# Patient Record
Sex: Female | Born: 1955 | Race: White | Hispanic: No | Marital: Single | Smoking: Former smoker
Health system: Southern US, Community
[De-identification: ages and names within clinical notes are randomized; demographics above are authoritative.]

## PROBLEM LIST (undated history)

## (undated) DIAGNOSIS — C801 Malignant (primary) neoplasm, unspecified: Secondary | ICD-10-CM

## (undated) DIAGNOSIS — Z923 Personal history of irradiation: Secondary | ICD-10-CM

## (undated) DIAGNOSIS — C50919 Malignant neoplasm of unspecified site of unspecified female breast: Secondary | ICD-10-CM

## (undated) HISTORY — PX: SPINAL FUSION: SHX223

## (undated) HISTORY — PX: EYE SURGERY: SHX253

---

## 2009-02-13 ENCOUNTER — Encounter: Admission: RE | Admit: 2009-02-13 | Discharge: 2009-02-13 | Payer: Self-pay | Admitting: Family Medicine

## 2011-08-18 ENCOUNTER — Other Ambulatory Visit: Payer: Self-pay | Admitting: Family Medicine

## 2011-08-18 ENCOUNTER — Other Ambulatory Visit (HOSPITAL_COMMUNITY)
Admission: RE | Admit: 2011-08-18 | Discharge: 2011-08-18 | Disposition: A | Discharge status: Home / Self Care | Payer: BC Managed Care – PPO | Source: Ambulatory Visit | Attending: Family Medicine | Admitting: Family Medicine

## 2011-08-18 DIAGNOSIS — Z124 Encounter for screening for malignant neoplasm of cervix: Secondary | ICD-10-CM | POA: Insufficient documentation

## 2011-08-18 DIAGNOSIS — Z1159 Encounter for screening for other viral diseases: Secondary | ICD-10-CM | POA: Insufficient documentation

## 2011-08-18 DIAGNOSIS — R921 Mammographic calcification found on diagnostic imaging of breast: Secondary | ICD-10-CM

## 2011-08-27 ENCOUNTER — Ambulatory Visit
Admission: RE | Admit: 2011-08-27 | Discharge: 2011-08-27 | Disposition: A | Discharge status: Home / Self Care | Payer: BC Managed Care – PPO | Source: Ambulatory Visit | Attending: Family Medicine | Admitting: Family Medicine

## 2011-08-27 DIAGNOSIS — R921 Mammographic calcification found on diagnostic imaging of breast: Secondary | ICD-10-CM

## 2012-10-18 ENCOUNTER — Other Ambulatory Visit: Payer: Self-pay

## 2012-10-18 DIAGNOSIS — Z1231 Encounter for screening mammogram for malignant neoplasm of breast: Secondary | ICD-10-CM

## 2012-10-28 ENCOUNTER — Other Ambulatory Visit: Payer: Self-pay | Admitting: Family Medicine

## 2012-10-28 ENCOUNTER — Ambulatory Visit
Admission: RE | Admit: 2012-10-28 | Discharge: 2012-10-28 | Disposition: A | Discharge status: Home / Self Care | Payer: BC Managed Care – PPO | Source: Ambulatory Visit | Attending: Family Medicine | Admitting: Family Medicine

## 2012-10-28 DIAGNOSIS — M25462 Effusion, left knee: Secondary | ICD-10-CM

## 2012-11-01 ENCOUNTER — Ambulatory Visit
Admission: RE | Admit: 2012-11-01 | Discharge: 2012-11-01 | Disposition: A | Discharge status: Home / Self Care | Payer: BC Managed Care – PPO | Source: Ambulatory Visit

## 2012-11-01 DIAGNOSIS — Z1231 Encounter for screening mammogram for malignant neoplasm of breast: Secondary | ICD-10-CM

## 2013-05-17 ENCOUNTER — Other Ambulatory Visit: Payer: Self-pay | Admitting: Family Medicine

## 2013-05-17 ENCOUNTER — Ambulatory Visit
Admission: RE | Admit: 2013-05-17 | Discharge: 2013-05-17 | Disposition: A | Discharge status: Home / Self Care | Payer: BC Managed Care – PPO | Source: Ambulatory Visit | Attending: Family Medicine | Admitting: Family Medicine

## 2013-05-17 DIAGNOSIS — M25469 Effusion, unspecified knee: Secondary | ICD-10-CM

## 2013-05-17 DIAGNOSIS — R109 Unspecified abdominal pain: Secondary | ICD-10-CM

## 2013-10-10 ENCOUNTER — Other Ambulatory Visit: Payer: Self-pay

## 2013-10-10 DIAGNOSIS — Z1231 Encounter for screening mammogram for malignant neoplasm of breast: Secondary | ICD-10-CM

## 2013-11-07 ENCOUNTER — Ambulatory Visit
Admission: RE | Admit: 2013-11-07 | Discharge: 2013-11-07 | Disposition: A | Discharge status: Home / Self Care | Payer: BC Managed Care – PPO | Source: Ambulatory Visit

## 2013-11-07 DIAGNOSIS — Z1231 Encounter for screening mammogram for malignant neoplasm of breast: Secondary | ICD-10-CM

## 2015-02-08 ENCOUNTER — Other Ambulatory Visit: Payer: Self-pay

## 2015-02-08 DIAGNOSIS — Z1231 Encounter for screening mammogram for malignant neoplasm of breast: Secondary | ICD-10-CM

## 2015-02-27 ENCOUNTER — Ambulatory Visit
Admission: RE | Admit: 2015-02-27 | Discharge: 2015-02-27 | Disposition: A | Discharge status: Home / Self Care | Payer: 59 | Source: Ambulatory Visit

## 2015-02-27 DIAGNOSIS — Z1231 Encounter for screening mammogram for malignant neoplasm of breast: Secondary | ICD-10-CM

## 2016-01-23 ENCOUNTER — Other Ambulatory Visit: Payer: Self-pay | Admitting: Family Medicine

## 2016-01-23 DIAGNOSIS — Z1231 Encounter for screening mammogram for malignant neoplasm of breast: Secondary | ICD-10-CM

## 2016-03-09 HISTORY — PX: BREAST LUMPECTOMY: SHX2

## 2016-03-12 ENCOUNTER — Ambulatory Visit: Payer: Self-pay

## 2016-03-26 ENCOUNTER — Inpatient Hospital Stay: Admission: RE | Admit: 2016-03-26 | Payer: Self-pay | Source: Ambulatory Visit

## 2016-04-09 DIAGNOSIS — C801 Malignant (primary) neoplasm, unspecified: Secondary | ICD-10-CM

## 2016-04-09 DIAGNOSIS — C50919 Malignant neoplasm of unspecified site of unspecified female breast: Secondary | ICD-10-CM

## 2016-04-09 HISTORY — DX: Malignant (primary) neoplasm, unspecified: C80.1

## 2016-04-09 HISTORY — DX: Malignant neoplasm of unspecified site of unspecified female breast: C50.919

## 2016-04-20 ENCOUNTER — Ambulatory Visit
Admission: RE | Admit: 2016-04-20 | Discharge: 2016-04-20 | Disposition: A | Discharge status: Home / Self Care | Payer: 59 | Source: Ambulatory Visit | Attending: Family Medicine | Admitting: Family Medicine

## 2016-04-20 DIAGNOSIS — Z1231 Encounter for screening mammogram for malignant neoplasm of breast: Secondary | ICD-10-CM

## 2016-04-21 ENCOUNTER — Other Ambulatory Visit: Payer: Self-pay | Admitting: Family Medicine

## 2016-04-21 DIAGNOSIS — R928 Other abnormal and inconclusive findings on diagnostic imaging of breast: Secondary | ICD-10-CM

## 2016-04-24 ENCOUNTER — Ambulatory Visit
Admission: RE | Admit: 2016-04-24 | Discharge: 2016-04-24 | Disposition: A | Discharge status: Home / Self Care | Payer: 59 | Source: Ambulatory Visit | Attending: Family Medicine | Admitting: Family Medicine

## 2016-04-24 ENCOUNTER — Other Ambulatory Visit: Payer: Self-pay | Admitting: Family Medicine

## 2016-04-24 DIAGNOSIS — N6320 Unspecified lump in the left breast, unspecified quadrant: Secondary | ICD-10-CM

## 2016-04-24 DIAGNOSIS — R928 Other abnormal and inconclusive findings on diagnostic imaging of breast: Secondary | ICD-10-CM

## 2016-04-28 ENCOUNTER — Telehealth: Payer: Self-pay | Admitting: *Deleted

## 2016-04-28 NOTE — Telephone Encounter (Signed)
Confirmed [facility] for 04/29/16 at 0815 .  Instructions and contact information given.

## 2016-05-05 ENCOUNTER — Other Ambulatory Visit: Payer: Self-pay | Admitting: *Deleted

## 2016-05-05 DIAGNOSIS — Z170 Estrogen receptor positive status [ER+]: Principal | ICD-10-CM

## 2016-05-05 DIAGNOSIS — C50212 Malignant neoplasm of upper-inner quadrant of left female breast: Secondary | ICD-10-CM

## 2016-05-06 ENCOUNTER — Other Ambulatory Visit: Payer: Self-pay | Admitting: *Deleted

## 2016-05-06 ENCOUNTER — Ambulatory Visit (HOSPITAL_BASED_OUTPATIENT_CLINIC_OR_DEPARTMENT_OTHER): Payer: 59 | Admitting: Hematology and Oncology

## 2016-05-06 ENCOUNTER — Encounter: Payer: Self-pay | Admitting: Physical Therapy

## 2016-05-06 ENCOUNTER — Ambulatory Visit
Admission: RE | Admit: 2016-05-06 | Discharge: 2016-05-06 | Disposition: A | Discharge status: Home / Self Care | Payer: 59 | Source: Ambulatory Visit | Attending: Radiation Oncology | Admitting: Radiation Oncology

## 2016-05-06 ENCOUNTER — Other Ambulatory Visit (HOSPITAL_BASED_OUTPATIENT_CLINIC_OR_DEPARTMENT_OTHER): Payer: 59

## 2016-05-06 ENCOUNTER — Ambulatory Visit: Payer: 59 | Attending: Surgery | Admitting: Physical Therapy

## 2016-05-06 ENCOUNTER — Encounter: Payer: Self-pay | Admitting: *Deleted

## 2016-05-06 ENCOUNTER — Encounter: Payer: Self-pay | Admitting: Hematology and Oncology

## 2016-05-06 ENCOUNTER — Other Ambulatory Visit: Payer: Self-pay | Admitting: Surgery

## 2016-05-06 DIAGNOSIS — C50212 Malignant neoplasm of upper-inner quadrant of left female breast: Secondary | ICD-10-CM

## 2016-05-06 DIAGNOSIS — Z170 Estrogen receptor positive status [ER+]: Principal | ICD-10-CM

## 2016-05-06 DIAGNOSIS — R293 Abnormal posture: Secondary | ICD-10-CM | POA: Insufficient documentation

## 2016-05-06 DIAGNOSIS — C50912 Malignant neoplasm of unspecified site of left female breast: Secondary | ICD-10-CM

## 2016-05-06 LAB — CBC WITH DIFFERENTIAL/PLATELET
BASO%: 0.1 % (ref 0.0–2.0)
Basophils Absolute: 0 10*3/uL (ref 0.0–0.1)
EOS%: 2.2 % (ref 0.0–7.0)
Eosinophils Absolute: 0.2 10*3/uL (ref 0.0–0.5)
HEMATOCRIT: 40.4 % (ref 34.8–46.6)
HEMOGLOBIN: 13.7 g/dL (ref 11.6–15.9)
LYMPH#: 1.6 10*3/uL (ref 0.9–3.3)
LYMPH%: 23.6 % (ref 14.0–49.7)
MCH: 32.2 pg (ref 25.1–34.0)
MCHC: 33.9 g/dL (ref 31.5–36.0)
MCV: 95.1 fL (ref 79.5–101.0)
MONO#: 0.5 10*3/uL (ref 0.1–0.9)
MONO%: 7.1 % (ref 0.0–14.0)
NEUT%: 67 % (ref 38.4–76.8)
NEUTROS ABS: 4.6 10*3/uL (ref 1.5–6.5)
PLATELETS: 218 10*3/uL (ref 145–400)
RBC: 4.25 10*6/uL (ref 3.70–5.45)
RDW: 13.3 % (ref 11.2–14.5)
WBC: 6.9 10*3/uL (ref 3.9–10.3)

## 2016-05-06 LAB — COMPREHENSIVE METABOLIC PANEL
ALT: 26 U/L (ref 0–55)
ANION GAP: 12 meq/L — AB (ref 3–11)
AST: 24 U/L (ref 5–34)
Albumin: 4.7 g/dL (ref 3.5–5.0)
Alkaline Phosphatase: 79 U/L (ref 40–150)
BILIRUBIN TOTAL: 0.47 mg/dL (ref 0.20–1.20)
BUN: 14.5 mg/dL (ref 7.0–26.0)
CALCIUM: 9.8 mg/dL (ref 8.4–10.4)
CHLORIDE: 103 meq/L (ref 98–109)
CO2: 24 meq/L (ref 22–29)
CREATININE: 0.8 mg/dL (ref 0.6–1.1)
EGFR: 82 mL/min/{1.73_m2} — ABNORMAL LOW (ref >90)
Glucose: 114 mg/dl (ref 70–140)
Potassium: 3.9 mEq/L (ref 3.5–5.1)
Sodium: 138 mEq/L (ref 136–145)
TOTAL PROTEIN: 7.6 g/dL (ref 6.4–8.3)

## 2016-05-06 NOTE — Progress Notes (Signed)
Clinical Social Work [place] Psychosocial Distress Screening [place]  Patient completed distress screening protocol and scored a 7 on the Psychosocial Distress Thermometer which indicates moderate distress. Clinical Social Worker met with patient in [facility] to assess for distress and other psychosocial needs. Patient stated she was feeling overwhelmed but felt "better" after meeting with the treatment team and getting more information on her treatment plan. CSW and patient discussed common feeling and emotions when being diagnosed with cancer, and the importance of support during treatment. CSW informed patient of the support team and support services at [facility]. CSW provided contact information and encouraged patient to call with any questions or concerns.   ONCBCN DISTRESS SCREENING 05/06/2016  Screening Type Initial Screening  Distress experienced in past week (1-10) 7  Practical problem type Work/school  Emotional problem type Nervousness/Anxiety  Physical Problem type Sleep/insomnia  Physician notified of physical symptoms Yes    Johnnye Lana, MSW, LCSW, OSW-C Clinical Social Worker [place] [phone]

## 2016-05-06 NOTE — Progress Notes (Signed)
[facility] CONSULT NOTE  Patient Care Team: Dibas Koirala, MD as PCP - General (Family Medicine) Alphonsa Overall, MD as Consulting Physician (General Surgery) Nicholas Lose, MD as Consulting Physician (Hematology and Oncology) Eppie Gibson, MD as Attending Physician (Radiation Oncology)  CHIEF COMPLAINTS/PURPOSE OF CONSULTATION:  Newly diagnosed breast cancer  HISTORY OF PRESENTING ILLNESS:  Candice Caldwell 61 y.o. female is here because of recent diagnosis of left breast cancer. Patient had a screening mammogram that detected left breast distortion with calcifications in the upper inner quadrant. By ultrasound at 11:00 it measured 1.1 cm. There was an adjacent mass at 12:00 position also 1.1 cm. Both of these lesions were biopsied. 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 The total o'clock mass was benign and it was concordant. Axilla was negative. She was presented this morning at the multidisciplinary tumor board and she is here today to discuss the treatment plan. 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  I reviewed her records extensively and collaborated the history with the patient.  SUMMARY OF ONCOLOGIC HISTORY:   Malignant neoplasm of upper-inner quadrant of left breast in female, estrogen receptor positive ([place])   04/24/2016 Initial Diagnosis    Left breast biopsy 12:00: Benign, left breast biopsy 11:00: IDC low-grade, ER 100%, PR 95%, HER-2 negative ratio 1.68, Ki-67 5%; screening detected left breast distortion and calcifications UIQ 1.1 cm at 11:00 position axilla negative, T1 cN0 stage IA clinical stage      MEDICAL HISTORY:  History reviewed. No pertinent past medical history.  SURGICAL HISTORY: Past Surgical History:  Procedure Laterality Date  . SPINAL FUSION      SOCIAL HISTORY: Social History   Social History  . Marital status:  Single    Spouse name: N/A  . Number of children: N/A  . Years of education: N/A   Occupational History  . Not on file.   Social History Main Topics  . Smoking status: Former Smoker    Types: Cigarettes    Quit date: 04/30/2016  . Smokeless tobacco: Not on file  . Alcohol use Yes     Comment: 6-8 week  . Drug use: No  . Sexual activity: Not on file   Other Topics Concern  . Not on file   Social History Narrative  . No narrative on file    FAMILY HISTORY: History reviewed. No pertinent family history.  ALLERGIES:  has No Known Allergies.  MEDICATIONS:  Current Outpatient Prescriptions  Medication Sig Dispense Refill  . aspirin 81 MG tablet Take 81 mg by mouth daily.    . Multiple Vitamins-Minerals (OCUVITE EXTRA) TABS Take 1 tablet by mouth daily.    . Omega-3 Fatty Acids (FISH OIL CONCENTRATE PO) Take 1 tablet by mouth daily.     No current facility-administered medications for this visit.     REVIEW OF SYSTEMS:   Constitutional: Denies fevers, chills or abnormal night sweats Eyes: Denies blurriness of vision, double vision or watery eyes Ears, nose, mouth, throat, and face: Denies mucositis or sore throat Respiratory: Denies cough, dyspnea or wheezes Cardiovascular: Denies palpitation, chest discomfort or lower extremity swelling Gastrointestinal:  Denies nausea, heartburn or change in bowel habits Skin: Denies abnormal skin rashes Lymphatics: Denies new lymphadenopathy or easy bruising Neurological:Denies numbness, tingling or new weaknesses Behavioral/Psych: Mood is stable, no new changes  Breast:  Denies any palpable  lumps or discharge All other systems were reviewed with the patient and are negative.  PHYSICAL EXAMINATION: ECOG PERFORMANCE STATUS: 0 - Asymptomatic  Vitals:   05/06/16 0837  BP: (!) 165/80  Pulse: 94  Resp: 18  Temp: 98.3 F (36.8 C)   Filed Weights   05/06/16 0837  Weight: 137 lb 12.8 oz (62.5 kg)    GENERAL:alert, no distress  and comfortable SKIN: skin color, texture, turgor are normal, no rashes or significant lesions EYES: normal, conjunctiva are pink and non-injected, sclera clear OROPHARYNX:no exudate, no erythema and lips, buccal mucosa, and tongue normal  NECK: supple, thyroid normal size, non-tender, without nodularity LYMPH:  no palpable lymphadenopathy in the cervical, axillary or inguinal LUNGS: clear to auscultation and percussion with normal breathing effort HEART: regular rate & rhythm and no murmurs and no lower extremity edema ABDOMEN:abdomen soft, non-tender and normal bowel sounds Musculoskeletal:no cyanosis of digits and no clubbing  PSYCH: alert & oriented x 3 with fluent speech NEURO: no focal motor/sensory deficits BREAST: No palpable nodules in breast. No palpable axillary or supraclavicular lymphadenopathy (exam performed in the presence of a chaperone)   LABORATORY DATA:  I have reviewed the data as listed Lab Results  Component Value Date   WBC 6.9 05/06/2016   HGB 13.7 05/06/2016   HCT 40.4 05/06/2016   MCV 95.1 05/06/2016   PLT 218 05/06/2016   Lab Results  Component Value Date   NA 138 05/06/2016   K 3.9 05/06/2016   CO2 24 05/06/2016    RADIOGRAPHIC STUDIES: I have personally reviewed the radiological reports and agreed with the findings in the report.  ASSESSMENT AND PLAN:  Malignant neoplasm of upper-inner quadrant of left breast in female, estrogen receptor positive ([place]) Left breast biopsy 12:00: Benign, left breast biopsy 11:00: IDC low-grade, ER 100%, PR 95%, HER-2 negative ratio 1.68, Ki-67 5%; screening detected left breast distortion and calcifications UIQ 1.1 cm at 11:00 position axilla negative, T1 cN0 stage IA clinical stage  Pathology and radiology counseling:Discussed with the patient, the details of pathology including the type of breast cancer,the clinical staging, the significance of ER, PR and HER-2/neu receptors and the implications for treatment. After  reviewing the pathology in detail, we proceeded to discuss the different treatment options between surgery, radiation, chemotherapy, antiestrogen therapies.  Recommendations: 1. Breast conserving surgery followed by 2. Oncotype DX testing to determine if chemotherapy would be of any benefit followed by 3. Adjuvant radiation therapy followed by 4. Adjuvant antiestrogen therapy  Oncotype counseling: I discussed Oncotype DX test. I explained to the patient that this is a 21 gene panel to evaluate patient tumors DNA to calculate recurrence score. This would help determine whether patient has high risk or intermediate risk or low risk breast cancer. She understands that if her tumor was found to be high risk, she would benefit from systemic chemotherapy. If low risk, no need of chemotherapy. If she was found to be intermediate risk, we would need to evaluate the score as well as other risk factors and determine if an abbreviated chemotherapy may be of benefit.  Return to clinic after surgery to discuss final pathology report and then determine if Oncotype DX testing will need to be sent.   All questions were answered. The patient knows to call the clinic with any problems, questions or concerns.    Rulon Eisenmenger, MD 05/06/16

## 2016-05-06 NOTE — Patient Instructions (Signed)

## 2016-05-06 NOTE — Therapy (Addendum)
[place], [place], [address] Phone: [phone]   Fax:  [phone]  Physical Therapy Evaluation  Patient Details  Name: Candice Caldwell MRN: [id number] Date of Birth: 25-Nov-1955 Referring Provider: Dr. Alphonsa Overall  Encounter Date: 05/06/2016      PT End of Session - 05/06/16 1051    Visit Number 1   Number of Visits 1   PT Start Time 0349   PT Stop Time 0955  Also saw pt from 1045 to 1055 for a total of 30 minutes   PT Time Calculation (min) 20 min   Activity Tolerance Patient tolerated treatment well   Behavior During Therapy [facility] for tasks assessed/performed      History reviewed. No pertinent past medical history.  Past Surgical History:  Procedure Laterality Date  . SPINAL FUSION      There were no vitals filed for this visit.       Subjective Assessment - 05/06/16 1052    Subjective Patient reports she is here today to be seen by her medical team for her newly diagnosed left breast cancer.   Pertinent History Patient was diagnosed on 04/20/16 with left invasive ductal carcinoma breast cancer. There are 2 masses both measuring 1.1 cm located in the upper inner quadrant. They are ER/PR positive and HER2 negative with a Ki67 of 5%. She has no other medical problems.   Patient Stated Goals reduce lymphedema risk and learn post op shoulder ROM HEP            [facility] PT Assessment - 05/06/16 0001      Assessment   Medical Diagnosis Left breast cancer   Referring Provider Dr. Alphonsa Overall   Onset Date/Surgical Date 04/20/16   Hand Dominance Right   Prior Therapy none     Precautions   Precautions Other (comment)   Precaution Comments active cancer     Restrictions   Weight Bearing Restrictions No     Balance Screen   Has the patient fallen in the past 6 months No   Has the patient had a decrease in activity level because of a fear of falling?  No   Is the patient reluctant to  leave their home because of a fear of falling?  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   Leisure She walks 6-7 miles (2 hours) 3x/week     Cognition   Overall Cognitive Status Within Functional Limits for tasks assessed     Posture/Postural Control   Posture/Postural Control Postural limitations   Postural Limitations Rounded Shoulders;Forward head     ROM / Strength   AROM / PROM / Strength AROM;Strength     AROM   AROM Assessment Site Shoulder;Cervical   Right/Left Shoulder Right;Left   Right Shoulder Extension 70 Degrees   Right Shoulder Flexion 154 Degrees   Right Shoulder ABduction 163 Degrees   Right Shoulder Internal Rotation 74 Degrees   Right Shoulder External Rotation 72 Degrees   Left Shoulder Extension 60 Degrees   Left Shoulder Flexion 150 Degrees   Left Shoulder ABduction 159 Degrees   Left Shoulder Internal Rotation 62 Degrees   Left Shoulder External Rotation 72 Degrees   Cervical Flexion WNL   Cervical Extension 75% limited  Cervical - Right Side Bend 50% limited   Cervical - Left Side Bend 50% limited   Cervical - Right Rotation 50% limited   Cervical - Left Rotation 50% limited     Strength   Overall Strength Within functional limits for tasks performed           LYMPHEDEMA/ONCOLOGY QUESTIONNAIRE - 05/06/16 1008      Type   Cancer Type Left breast cancer     Lymphedema Assessments   Lymphedema Assessments Upper extremities     Right Upper Extremity Lymphedema   10 cm Proximal to Olecranon Process 25.6 cm   Olecranon Process 23 cm   10 cm Proximal to Ulnar Styloid Process 20.3 cm   Just Proximal to Ulnar Styloid Process 14.9 cm   Across Hand at [employer] 19 cm   At [place] of 2nd Digit 6.3 cm     Left Upper Extremity  Lymphedema   10 cm Proximal to Olecranon Process 24.5 cm   Olecranon Process 23.4 cm   10 cm Proximal to Ulnar Styloid Process 19.2 cm   Just Proximal to Ulnar Styloid Process 14.6 cm   Across Hand at [employer] 18.8 cm   At [place] of 2nd Digit 6.1 cm        Patient was instructed today in a home exercise program today for post op shoulder range of motion. These included active assist shoulder flexion in sitting, scapular retraction, wall walking with shoulder abduction, and hands behind head external rotation.  She was encouraged to do these twice a day, holding 3 seconds and repeating 5 times when permitted by her physician.          PT Education - 05/06/16 1009    Education provided Yes   Education Details Lymphedema risk reduction and post op shoulder ROM HEP   Person(s) Educated Patient   Methods Explanation;Demonstration;Handout   Comprehension Returned demonstration;Verbalized understanding              Breast Clinic Goals - 05/06/16 1104      Patient will be able to verbalize understanding of pertinent lymphedema risk reduction practices relevant to her diagnosis specifically related to skin care.   Time 1   Period Days   Status Achieved     Patient will be able to return demonstrate and/or verbalize understanding of the post-op home exercise program related to regaining shoulder range of motion.   Time 1   Period Days   Status Achieved     Patient will be able to verbalize understanding of the importance of attending the postoperative After Breast Cancer Class for further lymphedema risk reduction education and therapeutic exercise.   Time 1   Period Days   Status Achieved              Plan - 05/06/16 1052    Clinical Impression Statement Patient was diagnosed on 04/20/16 with left invasive ductal carcinoma breast cancer. There are 2 masses both measuring 1.1 cm located in the upper inner quadrant. They are ER/PR positive and HER2 negative with  a Ki67 of 5%. She has no other medical problems. Her multidisciplinary medical team met prior to her assessments to determine a recommended treatment plan.  She is planning to have a left lumpectomy and sentinel node biopsy followed by Oncotype testing, radiation, and anti-estrogen therapy. She may benefit from post op PT to regain shoulder ROM and reduce lymphedema risk. Due to her lack of comorbidities, her eval is of  low complexity.   Rehab Potential Excellent   Clinical Impairments Affecting Rehab Potential none   PT Frequency One time visit   PT Treatment/Interventions Therapeutic exercise;Patient/family education   PT Next Visit Plan Will f/u after surgery to determine needs   PT Home Exercise Plan Post op shoulder ROM HEP   Consulted and Agree with Plan of Care Patient      Patient will benefit from skilled therapeutic intervention in order to improve the following deficits and impairments:  Postural dysfunction, Decreased knowledge of precautions, Impaired UE functional use, Decreased range of motion  Visit Diagnosis: Carcinoma of upper-inner quadrant of left breast in female, estrogen receptor positive ([place]) - Plan: PT plan of care cert/re-cert  Abnormal posture - Plan: PT plan of care cert/re-cert   Patient will follow up at outpatient cancer rehab if needed following surgery.  If the patient requires physical therapy at that time, a specific plan will be dictated and sent to the referring physician for approval. The patient was educated today on appropriate basic range of motion exercises to begin post operatively and the importance of attending the After Breast Cancer class following surgery.  Patient was educated today on lymphedema risk reduction practices as it pertains to recommendations that will benefit the patient immediately following surgery.  She verbalized good understanding.  No additional physical therapy is indicated at this time.     Problem List Patient Active  Problem List   Diagnosis Date Noted  . Malignant neoplasm of upper-inner quadrant of left breast in female, estrogen receptor positive ([place]) 05/05/2016    Annia Friendly, PT 05/06/16 12:04 PM  [place], [place], [address] Phone: [phone]   Fax:  [phone]  Name: Candice Caldwell MRN: [id number] Date of Birth: 1955/03/12

## 2016-05-06 NOTE — Assessment & Plan Note (Signed)
Left breast biopsy 12:00: Benign, left breast biopsy 11:00: IDC low-grade, ER 100%, PR 95%, HER-2 negative ratio 1.68, Ki-67 5%; screening detected left breast distortion and calcifications UIQ 1.1 cm at 11:00 position axilla negative, T1 cN0 stage IA clinical stage  Pathology and radiology counseling:Discussed with the patient, the details of pathology including the type of breast cancer,the clinical staging, the significance of ER, PR and HER-2/neu receptors and the implications for treatment. After reviewing the pathology in detail, we proceeded to discuss the different treatment options between surgery, radiation, chemotherapy, antiestrogen therapies.  Recommendations: 1. Breast conserving surgery followed by 2. Oncotype DX testing to determine if chemotherapy would be of any benefit followed by 3. Adjuvant radiation therapy followed by 4. Adjuvant antiestrogen therapy  Oncotype counseling: I discussed Oncotype DX test. I explained to the patient that this is a 21 gene panel to evaluate patient tumors DNA to calculate recurrence score. This would help determine whether patient has high risk or intermediate risk or low risk breast cancer. She understands that if her tumor was found to be high risk, she would benefit from systemic chemotherapy. If low risk, no need of chemotherapy. If she was found to be intermediate risk, we would need to evaluate the score as well as other risk factors and determine if an abbreviated chemotherapy may be of benefit.  Return to clinic after surgery to discuss final pathology report and then determine if Oncotype DX testing will need to be sent.

## 2016-05-06 NOTE — Progress Notes (Signed)
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 female who had a screening bilateral mammogram on 04/20/16. This showed a possible distortion and calcification in the left breast. Diagnostic mammogram of the left breast on 04/24/16 showed an architectural distortion in the UIQ left breast middle to posterior depth with associated faint microcalcifications. On physical exam, there was palpable thickening in the left breast 11:00 position anterior to middle depth. Ultrasound of the left breast showed an irregular mass measuring 0.8 x 1.1 x 1.0 cm in the 11:00 position 3 cm from the nipple and a mass measuring 1.0 x 1.1 x 0.9 cm in the 12:00 position 2 cm from the nipple. Distance between the two masses is 2.1 cm. There was no evidence of left axillary lymphadenopathy. Biopsied were collected on 04/24/16. Biopsy of the left breast 12:00 position revealed breast tissue with benign changes and no malignancy was identified. Biopsy of the left breast 11:00 position revealed low grade IDC (ER 100% positive, PR 95% positive, HER2 negative, Ki67 5%). The patient presents today in multidisciplinary breast clinic to discuss treatment options for the management of her disease.  Gynecologic History  Age at first menstrual period? 15  Are you still having periods? No  If you no longer have periods: Have you used hormone replacement? No Obstetric History:  How many children have you carried to term? 0  Pregnant now or trying to get pregnant? No  Have you used birth control pills or hormone shots for contraception? Yes, birth control  If so,  for how long (or approximate dates)? "Ages 51 to 80 or 50?" Health Maintenance:  Have you ever had a colonoscopy? Yes If yes, date? 10/17/2013  Have you ever had a bone density? No  Date of your last PAP smear? 08/18/2011 Date of your FIRST mammogram? ?  PREVIOUS RADIATION THERAPY: No  PAST MEDICAL HISTORY:  has no past medical history on file.    PAST SURGICAL HISTORY:No past surgical history on file.  FAMILY HISTORY: family history is not on file.  SOCIAL HISTORY:    ALLERGIES: Patient has no known allergies.  MEDICATIONS:  Current Outpatient Prescriptions  Medication Sig Dispense Refill  . aspirin 81 MG tablet Take 81 mg by mouth daily.    . Multiple Vitamins-Minerals (OCUVITE EXTRA) TABS Take 1 tablet by mouth daily.    . Omega-3 Fatty Acids (FISH OIL CONCENTRATE PO) Take 1 tablet by mouth daily.     No current facility-administered medications for this encounter.     REVIEW OF SYSTEMS:  On review of systems the patient reports loss of sleep, fatigue, and wears glasses. She denies difficulties with her ENT, hearth, lungs, bowels, urine, skin, muscle and bones, nervous system, mental health, hormones, blood/lymph system, or her immune system.  Pertinent items noted in HPI and remainder of comprehensive ROS otherwise negative.   PHYSICAL EXAM:  Vitals with BMI 05/06/2016  Height 5' 8"   Weight 137 lbs 13 oz  BMI 21  Systolic 621  Diastolic 80  Pulse 94  Respirations 18  General: Alert and oriented, in no acute distress HEENT: Head is normocephalic. Extraocular movements are intact.  Oropharynx is clear. Neck: Neck is supple, no palpable cervical or supraclavicular lymphadenopathy. Heart: Regular in rate and rhythm with no murmurs, rubs, or gallops. Chest: Clear to auscultation bilaterally, with no rhonchi, wheezes, or rales. Extremities: No cyanosis or edema. Lymphatics: see Neck Exam. Axillary exam was negative. Skin: No concerning lesions. Musculoskeletal: symmetric  strength and muscle tone throughout. Neurologic: Cranial nerves II through XII are grossly intact. No obvious focalities. Speech is fluent. Coordination is intact. Psychiatric: Judgment and insight are intact. Affect is appropriate. Breast: Right breast no palpable mass or nipple discharge. Left breast some bruising in the UOQ, no palpable mass, no nipple discharge or bleeding, some thickening noted in the 11-12:00 position that may be bruising from biopsy.  ECOG = 0  LABORATORY DATA:  Lab Results  Component Value Date   WBC 6.9 05/06/2016   HGB 13.7 05/06/2016   HCT 40.4 05/06/2016   MCV 95.1 05/06/2016   PLT 218 05/06/2016   NEUTROABS 4.6 05/06/2016   Lab Results  Component Value Date   NA 138 05/06/2016   K 3.9 05/06/2016   CO2 24 05/06/2016   GLUCOSE 114 05/06/2016   CREATININE 0.8 05/06/2016   CALCIUM 9.8 05/06/2016      RADIOGRAPHY: US Breast Ltd Uni Left Inc Axilla  Result Date: 04/24/2016 CLINICAL DATA:  Left superior breast architectural distortion with possible calcifications seen on most recent screening mammography. EXAM: 2D DIGITAL DIAGNOSTIC LEFT MAMMOGRAM WITH CAD AND ADJUNCT TOMO ULTRASOUND LEFT BREAST COMPARISON:  Previous exam(s). ACR Breast Density Category c: The breast tissue is heterogeneously dense, which may obscure small masses. FINDINGS: Mammographically, there is a persistent architectural distortion in the left breast upper inner quadrant, middle to posterior depth, with associated faint microcalcifications. Mammographic images were processed with CAD. On physical exam, there is a palpable thickening in the left 11 o'clock breast, anterior to middle depth. Targeted ultrasound is performed, showing left breast 11 o'clock 3 cm from the nipple irregular taller than wide hypoechoic mass which measures 0.8 x 1.1 x 1.0 cm. Internal hyperechoic foci may represent microcalcifications. There is an increased blood flow anteriorly. This finding likely corresponds to the  mammographically seen area of distortion. It the left breast 12 o'clock 2 cm from the nipple there is an ill-defined hypoechoic angulated mass versus dense breast tissue which measures 1.0 x 1.1 x 0.9 cm. The distance between the 2 masses is 2.1 cm. There is no evidence of left axillary lymphadenopathy. IMPRESSION: Two left breast masses, for which ultrasound-guided core needle biopsy is recommended. No evidence of left axillary lymphadenopathy. RECOMMENDATION: Ultrasound-guided core needle biopsy of the left breast. I have discussed the findings and recommendations with the patient. Results were also provided in writing at the conclusion of the visit. If applicable, a reminder letter will be sent to the patient regarding the next appointment. BI-RADS CATEGORY  4: Suspicious. Electronically Signed   By: Fidela Salisbury M.D.   On: 04/24/2016 09:38   Mm Diag Breast Tomo Uni Left  Result Date: 04/24/2016 CLINICAL DATA:  Left superior breast architectural distortion with possible calcifications seen on most recent screening mammography. EXAM: 2D DIGITAL DIAGNOSTIC LEFT MAMMOGRAM WITH CAD AND ADJUNCT TOMO ULTRASOUND LEFT BREAST COMPARISON:  Previous exam(s). ACR Breast Density Category c: The breast tissue is heterogeneously dense, which may obscure small masses. FINDINGS: Mammographically, there is a persistent architectural distortion in the left breast upper inner quadrant, middle to posterior depth, with associated faint microcalcifications. Mammographic images were processed with CAD. On physical  exam, there is a palpable thickening in the left 11 o'clock breast, anterior to middle depth. Targeted ultrasound is performed, showing left breast 11 o'clock 3 cm from the nipple irregular taller than wide hypoechoic mass which measures 0.8 x 1.1 x 1.0 cm. Internal hyperechoic foci may represent microcalcifications. There is an increased blood flow anteriorly. This finding likely corresponds to the mammographically  seen area of distortion. It the left breast 12 o'clock 2 cm from the nipple there is an ill-defined hypoechoic angulated mass versus dense breast tissue which measures 1.0 x 1.1 x 0.9 cm. The distance between the 2 masses is 2.1 cm. There is no evidence of left axillary lymphadenopathy. IMPRESSION: Two left breast masses, for which ultrasound-guided core needle biopsy is recommended. No evidence of left axillary lymphadenopathy. RECOMMENDATION: Ultrasound-guided core needle biopsy of the left breast. I have discussed the findings and recommendations with the patient. Results were also provided in writing at the conclusion of the visit. If applicable, a reminder letter will be sent to the patient regarding the next appointment. BI-RADS CATEGORY  4: Suspicious. Electronically Signed   By: Fidela Salisbury M.D.   On: 04/24/2016 09:38   Mm Screening Breast Tomo Bilateral  Result Date: 04/20/2016 CLINICAL DATA:  Screening. EXAM: 2D DIGITAL SCREENING BILATERAL MAMMOGRAM WITH CAD AND ADJUNCT TOMO COMPARISON:  Previous exam(s). ACR Breast Density Category d: The breast tissue is extremely dense, which lowers the sensitivity of mammography. FINDINGS: In the left breast, possible distortion and calcifications warrant further evaluation. In the right breast, no findings suspicious for malignancy. Images were processed with CAD. IMPRESSION: Further evaluation is suggested for possible distortion and calcifications in the left breast. RECOMMENDATION: Diagnostic mammogram and possibly ultrasound of the left breast. (Code:[id number]-L-40M) The patient will be contacted regarding the findings, and additional imaging will be scheduled. BI-RADS CATEGORY  0: Incomplete. Need additional imaging evaluation and/or prior mammograms for comparison. Electronically Signed   By: Ammie Ferrier M.D.   On: 04/20/2016 12:39   Mm Clip Placement Left  Result Date: 04/24/2016 CLINICAL DATA:  Post ultrasound-guided core needle biopsy of the  left breast. EXAM: DIAGNOSTIC LEFT MAMMOGRAM POST ULTRASOUND BIOPSY COMPARISON:  Previous exam(s). FINDINGS: Mammographic images were obtained following ultrasound-guided biopsy of left breast 11 o'clock and 12 o'clock masses. Two-view mammography demonstrates presence of heart shaped marker within the 12 o'clock biopsy site, and ribbon shaped marker within the 11 o'clock biopsy site. The ribbon shaped marker, associated with the 11 o'clock mass corresponds to the area of architectural distortion seen mammographically, and is located 5 mm anterior and 6 mm medial to the geometric center of the distortion. IMPRESSION: Successful placement of tissue markers, post ultrasound-guided biopsy of the left breast: 1. 12 o'clock mass- heart shaped marker. 2. Eleven o'clock mass- ribbon shaped marker. Final Assessment: Post Procedure Mammograms for Marker Placement Electronically Signed   By: Fidela Salisbury M.D.   On: 04/24/2016 16:16   [place] Lt Breast Bx W Loc Dev 1st Lesion Img Bx Spec US Guide  Addendum Date: 04/28/2016   ADDENDUM REPORT: 04/28/2016 08:47 ADDENDUM: Pathology revealed BREAST TISSUE WITH BENIGN CHANGES of the Left breast, 12:00 o'clock. The changes consist of microcalcifications, fibrosis, ductal epithelial hyperplasia and focal cystic dilatation. INVASIVE DUCTAL CARCINOMA, LOW GRADE of the Left breast, 11:00 o'clock. This was found to be concordant by Dr. Fidela Salisbury. Pathology results were discussed with the patient by telephone. The patient reported doing well after the biopsies with tenderness at the sites. Post biopsy instructions and  care were reviewed and questions were answered. The patient was encouraged to call [facility] for any additional concerns. The patient was referred to [facility] on May 06, 2016. Pathology results reported by Terie Purser, RN on 04/28/2016.  Electronically Signed   By: Fidela Salisbury M.D.   On: 04/28/2016 08:47   Result Date: 04/28/2016 CLINICAL DATA:  Left breast 11 o'clock suspicious mass, and left breast 12 o'clock mass versus dense fibroglandular tissue. EXAM: ULTRASOUND GUIDED LEFT BREAST CORE NEEDLE BIOPSY COMPARISON:  Previous exam(s). FINDINGS: I met with the patient and we discussed the procedure of ultrasound-guided biopsy, including benefits and alternatives. We discussed the high likelihood of a successful procedure. We discussed the risks of the procedure, including infection, bleeding, tissue injury, clip migration, and inadequate sampling. Informed written consent was given. The usual time-out protocol was performed immediately prior to the procedure. Using sterile technique and 1% Lidocaine as local anesthetic, under direct ultrasound visualization, a 14 gauge spring-loaded device was used to perform biopsy of left breast 12 o'clock mass using a latter approach. At the conclusion of the procedure a heart shaped tissue marker clip was deployed into the biopsy cavity. Next, using sterile technique and 1% Lidocaine as local anesthetic, under direct ultrasound visualization, a 14 gauge spring-loaded device was used to perform biopsy of left breast 11 o'clock mass using a lateral approach. At the conclusion of the procedure a ribbon shaped tissue marker clip was deployed into the biopsy cavity. Follow up 2 view mammogram was performed and dictated separately. IMPRESSION: Ultrasound guided biopsy of left breast 12 o'clock and 11 o'clock masses. No apparent complications. Electronically Signed: By: Fidela Salisbury M.D. On: 04/24/2016 15:45   [place] Lt Breast Bx W Loc Dev Ea Add Lesion Img Bx Spec US Guide  Addendum Date: 04/28/2016   ADDENDUM REPORT: 04/28/2016 08:47 ADDENDUM: Pathology revealed BREAST TISSUE WITH BENIGN CHANGES of the Left breast, 12:00 o'clock. The changes consist of microcalcifications, fibrosis, ductal epithelial  hyperplasia and focal cystic dilatation. INVASIVE DUCTAL CARCINOMA, LOW GRADE of the Left breast, 11:00 o'clock. This was found to be concordant by Dr. Fidela Salisbury. Pathology results were discussed with the patient by telephone. The patient reported doing well after the biopsies with tenderness at the sites. Post biopsy instructions and care were reviewed and questions were answered. The patient was encouraged to call [facility] for any additional concerns. The patient was referred to [facility] on May 06, 2016. Pathology results reported by Terie Purser, RN on 04/28/2016. Electronically Signed   By: Fidela Salisbury M.D.   On: 04/28/2016 08:47   Result Date: 04/28/2016 CLINICAL DATA:  Left breast 11 o'clock suspicious mass, and left breast 12 o'clock mass versus dense fibroglandular tissue. EXAM: ULTRASOUND GUIDED LEFT BREAST CORE NEEDLE BIOPSY COMPARISON:  Previous exam(s). FINDINGS: I met with the patient and we discussed the procedure of ultrasound-guided biopsy, including benefits and alternatives. We discussed the high likelihood of a successful procedure. We discussed the risks of the procedure, including infection, bleeding, tissue injury, clip migration, and inadequate sampling. Informed written consent was given. The usual time-out protocol was performed immediately prior to the procedure. Using sterile technique and 1% Lidocaine as local anesthetic, under direct ultrasound visualization, a 14 gauge spring-loaded device was used to perform biopsy of left breast 12 o'clock mass using a latter  approach. At the conclusion of the procedure a heart shaped tissue marker clip was deployed into the biopsy cavity. Next, using sterile technique and 1% Lidocaine as local anesthetic, under direct ultrasound visualization, a 14 gauge spring-loaded device was used to perform biopsy of left breast 11  o'clock mass using a lateral approach. At the conclusion of the procedure a ribbon shaped tissue marker clip was deployed into the biopsy cavity. Follow up 2 view mammogram was performed and dictated separately. IMPRESSION: Ultrasound guided biopsy of left breast 12 o'clock and 11 o'clock masses. No apparent complications. Electronically Signed: By: Fidela Salisbury M.D. 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 I spoke to the patient today regarding her diagnosis and options for treatment. We discussed the equivalence in terms of survival and local failure between mastectomy and breast conservation. We discussed the role of radiation in decreasing local failures in patients who undergo lumpectomy. We discussed the process of CT simulation and the placement tattoos. We discussed 4-6 weeks of treatment as an outpatient. We discussed the possibility of asymptomatic lung damage. We discussed the low likelihood of secondary malignancies. We discussed the possible side effects including but not limited to skin redness, fatigue, permanent skin darkening, and breast swelling.  We discussed the use of cardiac sparing with deep inspiration breath hold if needed.  PLAN: The patient will have a bilateral breast MRI to better visualize the patient's breasts due to having extremely dense breast tissue. She will undergo a left partial mastectomy with sentinel node procedure and Oncotype DX testing will be performed on the tissue to determine the benefit of adjuvant therapy. If chemotherapy is required, then radiation therapy would occur after this. If chemotherapy is of minimal benefit, then the patient would proceed to undergo radiation therapy after surgery. After completing radiation therapy she will be placed on  an aromatase inhibitor. I will see the patient a few weeks after surgery or after chemotherapy if it is required.     ------------------------------------------------  Blair Promise, PhD, MD  This document serves as a record of services personally performed by Gery Pray, MD. It was created on his behalf by Darcus Austin, a trained medical scribe. The creation of this record is based on the scribe's personal observations and the provider's statements to them. This document has been checked and approved by the attending provider.

## 2016-05-06 NOTE — Progress Notes (Signed)
Nutrition Assessment  Reason for Assessment:  Pt seen in Breast Clinic  ASSESSMENT:   61 year old female with new diagnosis of left breast cancer.  Past medical history reviewed  Patient reports good appetite.  Medications:  reviewed  Labs: reviewed  Anthropometrics:   Height: 68 inches Weight: 137 pounds BMI: 21   NUTRITION DIAGNOSIS: Food and nutrition related knowledge deficit related to new diagnosis of breast cancer as evidenced by no prior need for nutrition related information.  INTERVENTION:   Discussed and provided packet of information regarding nutritional tips for breast cancer patients.  Questions answered.  Teachback method used.  Contact information provided and patient knows to contact me with questions/concerns.    MONITORING, EVALUATION, and GOAL: Pt will consume a healthy plant based diet to maintain lean body mass throughout treatment.   Brittyn Salaz B. Zenia Resides, [place], [place] Registered Dietitian [phone] (pager)

## 2016-05-11 ENCOUNTER — Encounter (HOSPITAL_COMMUNITY): Payer: Self-pay

## 2016-05-11 ENCOUNTER — Telehealth: Payer: Self-pay | Admitting: *Deleted

## 2016-05-11 ENCOUNTER — Other Ambulatory Visit: Payer: Self-pay | Admitting: *Deleted

## 2016-05-11 ENCOUNTER — Ambulatory Visit (HOSPITAL_COMMUNITY)
Admission: RE | Admit: 2016-05-11 | Discharge: 2016-05-11 | Disposition: A | Discharge status: Home / Self Care | Payer: 59 | Source: Ambulatory Visit | Attending: Hematology and Oncology | Admitting: Hematology and Oncology

## 2016-05-11 DIAGNOSIS — Z170 Estrogen receptor positive status [ER+]: Principal | ICD-10-CM

## 2016-05-11 DIAGNOSIS — C50212 Malignant neoplasm of upper-inner quadrant of left female breast: Secondary | ICD-10-CM

## 2016-05-11 MED ORDER — GADOBENATE DIMEGLUMINE 529 MG/ML IV SOLN: 15.0000 mL | Freq: Once | INTRAVENOUS | Status: DC | PRN

## 2016-05-11 NOTE — Telephone Encounter (Signed)
  Oncology Nurse Navigator Documentation  Navigator Location: [facility] (05/11/16 1200)   )Navigator Encounter Type: Telephone (05/11/16 1200) Telephone: Outgoing Call;Clinic/[facility] Follow-up (05/11/16 1200)                                                  Time Spent with Patient: 15 (05/11/16 1200)

## 2016-05-11 NOTE — Progress Notes (Signed)
Attempted patient for Breast MRi, began [facility] MRI Seq and patient squeezed ball within 25 seconds yelling stating that she had intense sharp pain in her right mid to low back, I pulled her out and she stated is was only when I began the seq. I called Dr Logan Bores, radiologist, and we agreed to stop the scan. I will call Mateo Flow RN at the [facility] to let her know   bhj

## 2016-05-11 NOTE — Telephone Encounter (Signed)
This RN was contacted by Horris Latino in MRI stating inability to obtain MRI due to patient stating intense pain in lower back post start of MRI.  Per further inquiry- discomfort only occurred when MRI magnets were engaged.  Pt states she had " surgery in my lower back when I was a teenager and I think there are screws and pins in there"  This note will be given to MD for review for appropriate follow up.

## 2016-05-14 ENCOUNTER — Other Ambulatory Visit: Payer: Self-pay | Admitting: Surgery

## 2016-05-21 ENCOUNTER — Other Ambulatory Visit: Payer: Self-pay | Admitting: Surgery

## 2016-05-21 DIAGNOSIS — Z170 Estrogen receptor positive status [ER+]: Principal | ICD-10-CM

## 2016-05-21 DIAGNOSIS — C50912 Malignant neoplasm of unspecified site of left female breast: Secondary | ICD-10-CM

## 2016-05-25 ENCOUNTER — Telehealth: Payer: Self-pay | Admitting: Hematology and Oncology

## 2016-05-25 NOTE — Telephone Encounter (Signed)
Spoke with patient re 4/9 f/u.

## 2016-05-29 ENCOUNTER — Encounter (HOSPITAL_BASED_OUTPATIENT_CLINIC_OR_DEPARTMENT_OTHER): Payer: Self-pay | Admitting: *Deleted

## 2016-06-01 ENCOUNTER — Ambulatory Visit
Admission: RE | Admit: 2016-06-01 | Discharge: 2016-06-01 | Disposition: A | Discharge status: Home / Self Care | Payer: 59 | Source: Ambulatory Visit | Attending: Surgery | Admitting: Surgery

## 2016-06-01 DIAGNOSIS — Z170 Estrogen receptor positive status [ER+]: Principal | ICD-10-CM

## 2016-06-01 DIAGNOSIS — C50912 Malignant neoplasm of unspecified site of left female breast: Secondary | ICD-10-CM

## 2016-06-03 NOTE — Progress Notes (Signed)
Boost drink given with instructions to complete by 0615 dos, pt verbalized understanding. 

## 2016-06-04 ENCOUNTER — Ambulatory Visit
Admission: RE | Admit: 2016-06-04 | Discharge: 2016-06-04 | Disposition: A | Discharge status: Home / Self Care | Payer: 59 | Source: Ambulatory Visit | Attending: Surgery | Admitting: Surgery

## 2016-06-04 ENCOUNTER — Encounter (HOSPITAL_BASED_OUTPATIENT_CLINIC_OR_DEPARTMENT_OTHER): Payer: Self-pay | Admitting: *Deleted

## 2016-06-04 ENCOUNTER — Ambulatory Visit (HOSPITAL_BASED_OUTPATIENT_CLINIC_OR_DEPARTMENT_OTHER)
Admission: RE | Admit: 2016-06-04 | Discharge: 2016-06-04 | Disposition: A | Discharge status: Home / Self Care | Payer: Self-pay | Source: Ambulatory Visit | Attending: Surgery | Admitting: Surgery

## 2016-06-04 ENCOUNTER — Ambulatory Visit (HOSPITAL_COMMUNITY)
Admission: RE | Admit: 2016-06-04 | Discharge: 2016-06-04 | Disposition: A | Discharge status: Home / Self Care | Payer: Self-pay | Source: Ambulatory Visit | Attending: Surgery | Admitting: Surgery

## 2016-06-04 ENCOUNTER — Ambulatory Visit (HOSPITAL_BASED_OUTPATIENT_CLINIC_OR_DEPARTMENT_OTHER): Payer: Self-pay | Admitting: Anesthesiology

## 2016-06-04 ENCOUNTER — Encounter (HOSPITAL_BASED_OUTPATIENT_CLINIC_OR_DEPARTMENT_OTHER)
Admission: RE | Disposition: A | Discharge status: Home / Self Care | Payer: Self-pay | Source: Ambulatory Visit | Attending: Surgery

## 2016-06-04 DIAGNOSIS — Z8249 Family history of ischemic heart disease and other diseases of the circulatory system: Secondary | ICD-10-CM | POA: Insufficient documentation

## 2016-06-04 DIAGNOSIS — C50912 Malignant neoplasm of unspecified site of left female breast: Secondary | ICD-10-CM

## 2016-06-04 DIAGNOSIS — Z170 Estrogen receptor positive status [ER+]: Principal | ICD-10-CM

## 2016-06-04 DIAGNOSIS — Z8261 Family history of arthritis: Secondary | ICD-10-CM | POA: Insufficient documentation

## 2016-06-04 DIAGNOSIS — Z811 Family history of alcohol abuse and dependence: Secondary | ICD-10-CM | POA: Insufficient documentation

## 2016-06-04 DIAGNOSIS — K219 Gastro-esophageal reflux disease without esophagitis: Secondary | ICD-10-CM | POA: Insufficient documentation

## 2016-06-04 DIAGNOSIS — Z818 Family history of other mental and behavioral disorders: Secondary | ICD-10-CM | POA: Insufficient documentation

## 2016-06-04 DIAGNOSIS — Z9889 Other specified postprocedural states: Secondary | ICD-10-CM | POA: Insufficient documentation

## 2016-06-04 DIAGNOSIS — Z823 Family history of stroke: Secondary | ICD-10-CM | POA: Insufficient documentation

## 2016-06-04 DIAGNOSIS — Z86010 Personal history of colonic polyps: Secondary | ICD-10-CM | POA: Insufficient documentation

## 2016-06-04 DIAGNOSIS — F17200 Nicotine dependence, unspecified, uncomplicated: Secondary | ICD-10-CM | POA: Insufficient documentation

## 2016-06-04 DIAGNOSIS — Z836 Family history of other diseases of the respiratory system: Secondary | ICD-10-CM | POA: Insufficient documentation

## 2016-06-04 DIAGNOSIS — D0512 Intraductal carcinoma in situ of left breast: Secondary | ICD-10-CM | POA: Insufficient documentation

## 2016-06-04 HISTORY — PX: BREAST LUMPECTOMY WITH RADIOACTIVE SEED AND SENTINEL LYMPH NODE BIOPSY: SHX6550

## 2016-06-04 HISTORY — DX: Malignant (primary) neoplasm, unspecified: C80.1

## 2016-06-04 SURGERY — BREAST LUMPECTOMY WITH RADIOACTIVE SEED AND SENTINEL LYMPH NODE BIOPSY
Anesthesia: Regional | Site: Breast | Laterality: Left

## 2016-06-04 MED ORDER — ONDANSETRON HCL 4 MG/2ML IJ SOLN
INTRAMUSCULAR | Status: AC
  Filled 2016-06-04: qty 2

## 2016-06-04 MED ORDER — EPHEDRINE SULFATE 50 MG/ML IJ SOLN
INTRAMUSCULAR | Status: DC | PRN
  Administered 2016-06-04: 10 mg via INTRAVENOUS

## 2016-06-04 MED ORDER — CHLORHEXIDINE GLUCONATE CLOTH 2 % EX PADS: 6.0000 | MEDICATED_PAD | Freq: Once | CUTANEOUS | Status: DC

## 2016-06-04 MED ORDER — MIDAZOLAM HCL 2 MG/2ML IJ SOLN
INTRAMUSCULAR | Status: AC
  Filled 2016-06-04: qty 2

## 2016-06-04 MED ORDER — TECHNETIUM TC 99M SULFUR COLLOID FILTERED
1.0000 | Freq: Once | INTRAVENOUS | Status: AC | PRN
  Administered 2016-06-04: 1 via INTRADERMAL

## 2016-06-04 MED ORDER — PROPOFOL 10 MG/ML IV BOLUS
INTRAVENOUS | Status: AC
  Filled 2016-06-04: qty 20

## 2016-06-04 MED ORDER — FENTANYL CITRATE (PF) 100 MCG/2ML IJ SOLN
INTRAMUSCULAR | Status: AC
  Filled 2016-06-04: qty 2

## 2016-06-04 MED ORDER — BUPIVACAINE HCL 0.25 % IJ SOLN
INTRAMUSCULAR | Status: DC | PRN
  Administered 2016-06-04: 20 mL

## 2016-06-04 MED ORDER — CEFAZOLIN SODIUM-DEXTROSE 2-4 GM/100ML-% IV SOLN
INTRAVENOUS | Status: AC
Start: 2016-06-04 — End: 2016-06-04
  Filled 2016-06-04: qty 100

## 2016-06-04 MED ORDER — LACTATED RINGERS IV SOLN
INTRAVENOUS | Status: DC
  Administered 2016-06-04 (×2): via INTRAVENOUS

## 2016-06-04 MED ORDER — HYDROCODONE-ACETAMINOPHEN 5-325 MG PO TABS: 1.0000 | ORAL_TABLET | Freq: Four times a day (QID) | ORAL | 0 refills | Status: DC | PRN

## 2016-06-04 MED ORDER — LIDOCAINE HCL (CARDIAC) 20 MG/ML IV SOLN
INTRAVENOUS | Status: DC | PRN
  Administered 2016-06-04: 80 mg via INTRAVENOUS

## 2016-06-04 MED ORDER — GABAPENTIN 300 MG PO CAPS
300.0000 mg | ORAL_CAPSULE | ORAL | Status: AC
  Administered 2016-06-04: 300 mg via ORAL

## 2016-06-04 MED ORDER — MIDAZOLAM HCL 2 MG/2ML IJ SOLN
1.0000 mg | INTRAMUSCULAR | Status: DC | PRN
  Administered 2016-06-04: 2 mg via INTRAVENOUS

## 2016-06-04 MED ORDER — SCOPOLAMINE 1 MG/3DAYS TD PT72: 1.0000 | MEDICATED_PATCH | Freq: Once | TRANSDERMAL | Status: DC | PRN

## 2016-06-04 MED ORDER — BUPIVACAINE-EPINEPHRINE (PF) 0.5% -1:200000 IJ SOLN
INTRAMUSCULAR | Status: DC | PRN
  Administered 2016-06-04: 30 mL via PERINEURAL

## 2016-06-04 MED ORDER — FENTANYL CITRATE (PF) 100 MCG/2ML IJ SOLN: 25.0000 ug | INTRAMUSCULAR | Status: DC | PRN

## 2016-06-04 MED ORDER — DEXAMETHASONE SODIUM PHOSPHATE 10 MG/ML IJ SOLN
INTRAMUSCULAR | Status: AC
  Filled 2016-06-04: qty 1

## 2016-06-04 MED ORDER — CEFAZOLIN SODIUM-DEXTROSE 2-4 GM/100ML-% IV SOLN
2.0000 g | INTRAVENOUS | Status: AC
  Administered 2016-06-04: 2 g via INTRAVENOUS

## 2016-06-04 MED ORDER — PROPOFOL 10 MG/ML IV BOLUS
INTRAVENOUS | Status: DC | PRN
  Administered 2016-06-04: 150 mg via INTRAVENOUS

## 2016-06-04 MED ORDER — ACETAMINOPHEN 500 MG PO TABS
ORAL_TABLET | ORAL | Status: AC
  Filled 2016-06-04: qty 2

## 2016-06-04 MED ORDER — ACETAMINOPHEN 500 MG PO TABS
1000.0000 mg | ORAL_TABLET | ORAL | Status: AC
  Administered 2016-06-04: 1000 mg via ORAL

## 2016-06-04 MED ORDER — LIDOCAINE 2% (20 MG/ML) 5 ML SYRINGE
INTRAMUSCULAR | Status: AC
  Filled 2016-06-04: qty 5

## 2016-06-04 MED ORDER — PROMETHAZINE HCL 25 MG/ML IJ SOLN: 6.2500 mg | INTRAMUSCULAR | Status: DC | PRN

## 2016-06-04 MED ORDER — FENTANYL CITRATE (PF) 100 MCG/2ML IJ SOLN
50.0000 ug | INTRAMUSCULAR | Status: AC | PRN
  Administered 2016-06-04: 50 ug via INTRAVENOUS
  Administered 2016-06-04: 25 ug via INTRAVENOUS
  Administered 2016-06-04: 50 ug via INTRAVENOUS
  Administered 2016-06-04: 25 ug via INTRAVENOUS

## 2016-06-04 MED ORDER — ONDANSETRON HCL 4 MG/2ML IJ SOLN
INTRAMUSCULAR | Status: DC | PRN
  Administered 2016-06-04: 4 mg via INTRAVENOUS

## 2016-06-04 MED ORDER — GABAPENTIN 300 MG PO CAPS
ORAL_CAPSULE | ORAL | Status: AC
  Filled 2016-06-04: qty 1

## 2016-06-04 MED ORDER — DEXAMETHASONE SODIUM PHOSPHATE 4 MG/ML IJ SOLN
INTRAMUSCULAR | Status: DC | PRN
  Administered 2016-06-04: 10 mg via INTRAVENOUS

## 2016-06-04 SURGICAL SUPPLY — 50 items
BENZOIN TINCTURE PRP APPL 2/3 (GAUZE/BANDAGES/DRESSINGS) IMPLANT
BINDER BREAST LRG (GAUZE/BANDAGES/DRESSINGS) IMPLANT
BINDER BREAST MEDIUM (GAUZE/BANDAGES/DRESSINGS) ×3 IMPLANT
BINDER BREAST XLRG (GAUZE/BANDAGES/DRESSINGS) IMPLANT
BINDER BREAST XXLRG (GAUZE/BANDAGES/DRESSINGS) IMPLANT
BLADE SURG 15 STRL LF DISP TIS (BLADE) ×1 IMPLANT
BLADE SURG 15 STRL SS (BLADE) ×2
CANISTER SUC SOCK COL 7IN (MISCELLANEOUS) IMPLANT
CANISTER SUCT 1200ML W/VALVE (MISCELLANEOUS) ×3 IMPLANT
CHLORAPREP W/TINT 26ML (MISCELLANEOUS) ×3 IMPLANT
CLIP TI WIDE RED SMALL 6 (CLIP) ×3 IMPLANT
CLOSURE WOUND 1/2 X4 (GAUZE/BANDAGES/DRESSINGS)
COVER BACK TABLE 60X90IN (DRAPES) ×3 IMPLANT
COVER MAYO STAND STRL (DRAPES) ×3 IMPLANT
COVER PROBE W GEL 5X96 (DRAPES) ×3 IMPLANT
DECANTER SPIKE VIAL GLASS SM (MISCELLANEOUS) IMPLANT
DERMABOND ADVANCED (GAUZE/BANDAGES/DRESSINGS) ×2
DERMABOND ADVANCED .7 DNX12 (GAUZE/BANDAGES/DRESSINGS) ×1 IMPLANT
DEVICE DUBIN W/COMP PLATE 8390 (MISCELLANEOUS) ×3 IMPLANT
DRAPE LAPAROSCOPIC ABDOMINAL (DRAPES) ×3 IMPLANT
DRAPE UTILITY XL STRL (DRAPES) ×3 IMPLANT
DRSG PAD ABDOMINAL 8X10 ST (GAUZE/BANDAGES/DRESSINGS) IMPLANT
ELECT COATED BLADE 2.86 ST (ELECTRODE) ×3 IMPLANT
ELECT REM PT RETURN 9FT ADLT (ELECTROSURGICAL) ×3
ELECTRODE REM PT RTRN 9FT ADLT (ELECTROSURGICAL) ×1 IMPLANT
GAUZE SPONGE 4X4 12PLY STRL (GAUZE/BANDAGES/DRESSINGS) ×3 IMPLANT
GLOVE SURG SIGNA 7.5 PF LTX (GLOVE) ×6 IMPLANT
GOWN STRL REUS W/ TWL LRG LVL3 (GOWN DISPOSABLE) ×1 IMPLANT
GOWN STRL REUS W/ TWL XL LVL3 (GOWN DISPOSABLE) ×1 IMPLANT
GOWN STRL REUS W/TWL LRG LVL3 (GOWN DISPOSABLE) ×2
GOWN STRL REUS W/TWL XL LVL3 (GOWN DISPOSABLE) ×2
KIT MARKER MARGIN INK (KITS) ×3 IMPLANT
NDL SAFETY ECLIPSE 18X1.5 (NEEDLE) IMPLANT
NEEDLE HYPO 18GX1.5 SHARP (NEEDLE)
NEEDLE HYPO 25X1 1.5 SAFETY (NEEDLE) ×3 IMPLANT
NS IRRIG 1000ML POUR BTL (IV SOLUTION) ×3 IMPLANT
PACK BASIN DAY SURGERY FS (CUSTOM PROCEDURE TRAY) ×3 IMPLANT
PENCIL BUTTON HOLSTER BLD 10FT (ELECTRODE) ×3 IMPLANT
SHEET MEDIUM DRAPE 40X70 STRL (DRAPES) ×3 IMPLANT
SLEEVE SCD COMPRESS KNEE MED (MISCELLANEOUS) ×3 IMPLANT
SPONGE LAP 18X18 X RAY DECT (DISPOSABLE) ×3 IMPLANT
STRIP CLOSURE SKIN 1/2X4 (GAUZE/BANDAGES/DRESSINGS) IMPLANT
SUT MNCRL AB 4-0 PS2 18 (SUTURE) ×3 IMPLANT
SUT VICRYL 3-0 CR8 SH (SUTURE) ×3 IMPLANT
SYR CONTROL 10ML LL (SYRINGE) ×3 IMPLANT
TOWEL OR 17X24 6PK STRL BLUE (TOWEL DISPOSABLE) ×3 IMPLANT
TOWEL OR NON WOVEN STRL DISP B (DISPOSABLE) ×3 IMPLANT
TUBE CONNECTING 20'X1/4 (TUBING) ×1
TUBE CONNECTING 20X1/4 (TUBING) ×2 IMPLANT
YANKAUER SUCT BULB TIP NO VENT (SUCTIONS) ×3 IMPLANT

## 2016-06-04 NOTE — Interval H&P Note (Signed)
History and Physical Interval Note:  06/04/2016 10:11 AM  Candice Caldwell  has presented today for surgery, with the diagnosis of LEFT BREAST CANCER  The various methods of treatment have been discussed with the patient and family.  [name], with patient.  Seed in place.  After consideration of risks, benefits and other options for treatment, the patient has consented to  Procedure(s): LEFT BREAST LUMPECTOMY WITH RADIOACTIVE SEED AND SENTINEL LYMPH NODE BIOPSY (Left) as a surgical intervention .  The patient's history has been reviewed, patient examined, no change in status, stable for surgery.  I have reviewed the patient's chart and labs.  Questions were answered to the patient's satisfaction.     [name]

## 2016-06-04 NOTE — Anesthesia Postprocedure Evaluation (Signed)
Anesthesia Post Note  Patient: Candice Caldwell  Procedure(s) Performed: Procedure(s) (LRB): LEFT BREAST LUMPECTOMY WITH RADIOACTIVE SEED AND SENTINEL LYMPH NODE BIOPSY (Left)  Patient location during evaluation: PACU Anesthesia Type: General and Regional Level of consciousness: sedated and patient cooperative Pain management: pain level controlled Vital Signs Assessment: post-procedure vital signs reviewed and stable Respiratory status: spontaneous breathing Cardiovascular status: stable Anesthetic complications: no       Last Vitals:  Vitals:   06/04/16 1230 06/04/16 1245  BP: 125/72 133/80  Pulse: 62 70  Resp: 16 18  Temp:      Last Pain:  Vitals:   06/04/16 1230  TempSrc:   PainSc: 0-No pain                 Nolon Nations

## 2016-06-04 NOTE — Discharge Instructions (Signed)
[facility] - DISCHARGE INSTRUCTIONS TO PATIENT   Activity:  Driving - May drive in 1 to 3 days, depending on how you do.   Lifting - No lifting more that 15 pounds for 5 days, then no limit.  Wound Care:   Leave incision dry for 2 days, then removed bandages and shower.  Diet:  As tolerated.  Follow up appointment:  Call Dr. Pollie Friar office [facility]) at [phone] for an appointment in 2 to 3 weeks.  Medications and dosages:  Resume your home medications.  You have a prescription for:  Vicodin  Call Dr. Lucia Gaskins or his office  [phone]) if you have:  Temperature greater than 100.4,  Persistent nausea and vomiting,  Severe uncontrolled pain,  Redness, tenderness, or signs of infection (pain, swelling, redness, odor or green/yellow discharge around the site),  Any other questions or concerns you may have after discharge.  In an emergency, call 911 or go to an Emergency Department at a nearby hospital.      Post Anesthesia Home Care Instructions  Activity: Get plenty of rest for the remainder of the day. A responsible individual must stay with you for 24 hours following the procedure.  For the next 24 hours, DO NOT: -Drive a car -Paediatric nurse -Drink alcoholic beverages -Take any medication unless instructed by your physician -Make any legal decisions or sign important papers.  Meals: Start with liquid foods such as gelatin or soup. Progress to regular foods as tolerated. Avoid greasy, spicy, heavy foods. If nausea and/or vomiting occur, drink only clear liquids until the nausea and/or vomiting subsides. Call your physician if vomiting continues.  Special Instructions/Symptoms: Your throat may feel dry or sore from the anesthesia or the breathing tube placed in your throat during surgery. If this causes discomfort, gargle with warm salt water. The discomfort should disappear within 24 hours.  If you had a scopolamine patch placed  behind your ear for the management of post- operative nausea and/or vomiting:  1. The medication in the patch is effective for 72 hours, after which it should be removed.  Wrap patch in a tissue and discard in the trash. Wash hands thoroughly with soap and water. 2. You may remove the patch earlier than 72 hours if you experience unpleasant side effects which may include dry mouth, dizziness or visual disturbances. 3. Avoid touching the patch. Wash your hands with soap and water after contact with the patch.

## 2016-06-04 NOTE — Transfer of Care (Signed)
Immediate Anesthesia Transfer of Care Note  Patient: Candice Caldwell  Procedure(s) Performed: Procedure(s): LEFT BREAST LUMPECTOMY WITH RADIOACTIVE SEED AND SENTINEL LYMPH NODE BIOPSY (Left)  Patient Location: PACU  Anesthesia Type:GA combined with regional for post-op pain  Level of Consciousness: sedated  Airway & Oxygen Therapy: Patient Spontanous Breathing and Patient connected to face mask oxygen  Post-op Assessment: Report given to RN and Post -op Vital signs reviewed and stable  Post vital signs: Reviewed and stable  Last Vitals:  Vitals:   06/04/16 1013 06/04/16 1014  BP:    Pulse: 69 73  Resp: (!) 23 17  Temp:      Last Pain:  Vitals:   06/04/16 0844  TempSrc: Oral         Complications: No apparent anesthesia complications

## 2016-06-04 NOTE — Anesthesia Procedure Notes (Signed)
Procedure Name: LMA Insertion Date/Time: 06/04/2016 10:30 AM Performed by: Maryella Shivers Pre-anesthesia Checklist: Patient identified, Emergency Drugs available, Suction available and Patient being monitored Patient Re-evaluated:Patient Re-evaluated prior to inductionOxygen Delivery Method: Circle system utilized Preoxygenation: Pre-oxygenation with 100% oxygen Intubation Type: IV induction Ventilation: Mask ventilation without difficulty LMA: LMA inserted LMA Size: 4.0 Number of attempts: 1 Airway Equipment and Method: Bite block Placement Confirmation: positive ETCO2 Tube secured with: Tape Dental Injury: Teeth and Oropharynx as per pre-operative assessment

## 2016-06-04 NOTE — H&P (Signed)
Candice Caldwell  Location: [facility] Patient #: [id number] DOB: 20-Dec-1955 Undefined / Language: [name] / Race: White Female  History of Present Illness   The patient is a 61 year old female who presents with a complaint of Left breast cancer.  The PCP is Dr. Lujean Amel.  She was referred by Dr. Mechele Collin.  She is at the Breast [facility] - Drs. Lindi Adie and Kinard  She came by herself.  Her last mammogram was Dec 2016. She did not feel anything in her breast. Her last period was around 2007. She is not on hormone therapy. She has no family who had breast cancer. She is trying to give up smoking. She had mammograms at [facility] on 04/03/2016 which showed left breast mass at 11 o'clock - 1.1 x 1.0 cm and left breast 12 o'clock - 1.0 x 1.1 cm mass. Biopsy on 04/24/[phone]) - 12 o'clock showed benign changes (Dr. Lyndon Code called and said that that was condordant) at and 11 o'clock - IDC, low grade, ER/PR positive, Ki67 - 5%, Her2Neu - neg.  I discussed the options for breast cancer treatment with the patient. She is at the Breast multidisciplinary clinic, which includes medical oncology and radiation oncology. I discussed the surgical options of lumpectomy vs. mastectomy. If mastectomy, there is the possibility of reconstruction. I discussed the options of lymph node biopsy. The treatment plan depends on the pathologic staging of the tumor and the patient's personal wishes. The risks of surgery include, but are not limited to, bleeding, infection, the need for further surgery, and nerve injury. The patient has been given literature on the treatment of breast cancer.  Plan: 1) MRI (because of breast density and she is starting with 2 lesions), 2) Left breast lumpectomy and left axillary SLNBx, 3) Oncotype  Past Medical History: 1. She is trying to stop smoking. She stopped for several years, but then restarted. 2. Back surgery for scoliosis  as a teenage.  Social History: Divorced. She has 2 adopted children: [place] - 6 yo and [place]is - 28 yo - they live in [place] with their father becasue adopted children get some benefit for school. Works as a Cabin crew   Past Surgical History Tawni Pummel, RN; 05/06/2016 7:42 AM) Breast Biopsy  Left. Colon Polyp Removal - Colonoscopy  Oral Surgery  Spinal Surgery - Lower Back  Spinal Surgery Midback  Tonsillectomy   Diagnostic Studies History Tawni Pummel, RN; 05/06/2016 7:42 AM) Colonoscopy  1-5 years ago Mammogram  within last year Pap Smear  1-5 years ago  Medication History Tawni Pummel, RN; 05/06/2016 7:43 AM) Medications Reconciled  Social History Tawni Pummel, RN; 05/06/2016 7:42 AM) Alcohol use  Moderate alcohol use. Caffeine use  Coffee. Tobacco use  Former smoker.  Family History Tawni Pummel, RN; 05/06/2016 7:42 AM) Alcohol Abuse  Father. Anesthetic complications  Mother. Arthritis  Mother. Cerebrovascular Accident  Mother. Colon Polyps  Father. Depression  Family Members In General. Diabetes Mellitus  Family Members In General. Heart Disease  Father. Respiratory Condition  Father.  Pregnancy / Birth History Tawni Pummel, RN; 05/06/2016 7:42 AM) Age at menarche  73 years. Age of menopause  64-50 Contraceptive History  Intrauterine device, Oral contraceptives. Gravida  1 Irregular periods  Para  0  Other Problems Tawni Pummel, RN; 05/06/2016 7:42 AM) Back Pain  Breast Cancer  Gastroesophageal Reflux Disease  Heart murmur  Lump In Breast  Transfusion history     Review of Systems [name] RN; 05/06/2016 7:42 AM)  General Present- Fatigue. Not Present- Appetite Loss, Chills, Fever, Night Sweats, Weight Gain and Weight Loss. HEENT Present- Wears glasses/contact lenses. Not Present- Earache, Hearing Loss, Hoarseness, Nose Bleed, Oral Ulcers, Ringing in the Ears, Seasonal Allergies, Sinus Pain,  Sore Throat, Visual Disturbances and Yellow Eyes. Respiratory Not Present- Bloody sputum, Chronic Cough, Difficulty Breathing, Snoring and Wheezing. Breast Not Present- Breast Mass, Breast Pain, Nipple Discharge and Skin Changes. Cardiovascular Not Present- Chest Pain, Difficulty Breathing Lying Down, Leg Cramps, Palpitations, Rapid Heart Rate, Shortness of Breath and Swelling of Extremities. Gastrointestinal Not Present- Abdominal Pain, Bloating, Bloody Stool, Change in Bowel Habits, Chronic diarrhea, Constipation, Difficulty Swallowing, Excessive gas, Gets full quickly at meals, Hemorrhoids, Indigestion, Nausea, Rectal Pain and Vomiting. Female Genitourinary Not Present- Frequency, Nocturia, Painful Urination, Pelvic Pain and Urgency. Musculoskeletal Present- Joint Pain and Joint Stiffness. Not Present- Back Pain, Muscle Pain, Muscle Weakness and Swelling of Extremities. Neurological Not Present- Decreased Memory, Fainting, Headaches, Numbness, Seizures, Tingling, Tremor, Trouble walking and Weakness. Psychiatric Present- Change in Sleep Pattern. Not Present- Anxiety, Bipolar, Depression, Fearful and Frequent crying. Endocrine Not Present- Cold Intolerance, Excessive Hunger, Hair Changes, Heat Intolerance, Hot flashes and New Diabetes.   Physical Exam  General: Thin WFalert and generally healthy appearing. Skin: Inspection and palpation of the skin unremarkable.  Eyes: Conjunctivae white, pupils equal. Face, ears, nose, mouth, and throat: Face - normal. Normal ears and nose. Lips and teeth normal.  Neck: Supple. No mass. Trachea midline. No thyroid mass.  Lymph Nodes: No supraclavicular or cervical adenopathy. No axillary adenopathy.  Lungs: Normal respiratory effort. Clear to auscultation and symmetric breath sounds. Cardiovascular: Regular rate and rythm. Normal auscultation of the heart. No murmur or rub. Normal carotid pulse.  Breasts: Right - No mass or  nodule Left - Thickening and bruisin in the UOQ (1-2 o'clock) - but I am not sure how this correlates with the masse seen on mammogram.  Abdomen: Soft. No mass. Liver and spleen not palpable. No tenderness. No hernia. Normal bowel sounds. No abdominal scars. Rectal: Not done.  Musculoskeletal/extremities: Normal gait. Good strength and ROM in upper and lower extremities.   Neurologic: Grossly intact to motor and sensory function.   Psychiatric: Has normal mood and affect. Judgement and insight appear normal.  Assessment & Plan  1.  MALIGNANT NEOPLASM OF LEFT BREAST, STAGE 1, ESTROGEN RECEPTOR POSITIVE (C50.912)  Story: Mammograms at [facility] on 04/03/2016 which showed left breast mass at  11 o'clock - 1.1 x 1.0 cm and left breast 12 o'clock - 1.0 x 1.1 cm mass.  Biopsy on 04/24/[phone]) - 12 o'clock showed benign changes at and 11 o'clock - IDC, low grade, ER/PR positive, Ki67 - 5%, Her2Neu - neg.   Treating oncologist - Gudena/Kinard  Plan:   1) MRI (because of breast density and she is starting with 2 lesions),    Addendum Note(Henritta Mutz H. Albert Hersch MD; 05/14/2016 2:43 PM)    She could not tolerate the MRI because of back pain. She had back surgery for scoliosis as a teenager. So it must have been related to that surgery.    I talked to her and will set up left breast lumpectomy with left axillary SLNBx   2) Left breast lumpectomy and left axillary SLNBx,   3) Oncotype  Addendum Note(Larysa Pall H. Lucia Gaskins MD; 06/01/2016 2:11 PM)  Dr. Reynolds Bowl called me on 06/01/2016.  The clip is on one side of the distortion (cancer) and the clip is on the other. When I  get to the seed, go lateral.  He said to call the day of surgery if there is a question.  2. She is trying to stop smoking. She stopped for several years, but then restarted. 3. Back surgery for scoliosis as a teenage.    Alphonsa Overall, MD, [facility] Pager:  [phone] Office phone:  [phone]

## 2016-06-04 NOTE — Op Note (Signed)
06/04/2016  11:51 AM  PATIENT:  Candice Caldwell DOB: [birth year] MRN: [id number]  PREOP DIAGNOSIS:   LEFT BREAST CANCER  POSTOP DIAGNOSIS:    Left breast cancer, 12 o'clock position (T1, N0)  PROCEDURE:   Procedure(s): LEFT BREAST LUMPECTOMY WITH RADIOACTIVE SEED AND SENTINEL LYMPH NODE BIOPSY, deep sentinel lymph node biopsy  SURGEON:   Alphonsa Overall, M.D.  ANESTHESIA:   general  Anesthesiologist: Catalina Gravel, MD CRNA: Maryella Shivers, CRNA  General  EBL:  minimal  ml  DRAINS:  none   LOCAL MEDICATIONS USED:   20 cc 1/4% marcaine, anesthesia did pectoral block  SPECIMEN:   Left breast lumpectomy (6 color paint kit), left axillary lymph node biopsy (counts 600, background 30)  COUNTS CORRECT:  YES  INDICATIONS FOR PROCEDURE:  Etsuko Dierolf is a 61 y.o. (DOB: 1955-06-12) white female whose primary care physician is Lujean Amel, MD and comes for left breast lumpectomy and left axillary sentinel lymph node biopsy.   She presented to the Breast Multidisciplinary Clinic and was seen with Drs. Gudena and Kinard.  She has a IDC at the 11 o'clock position on mammogrphy.  We tried to do a MRI, but because of prior back surgery, we could not do this.   The options for breast cancer treatment have been discussed with the patient. She elected to proceed with lumpectomy and axillary sentinel lymph node.     The indications and potential complications of surgery were explained to the patient. Potential complications include, but are not limited to, bleeding, infection, the need for further surgery, and nerve injury.     She had a I131 seed placed on 06/01/2016 in her left breast at [facility].  I confirmed the presence of the I131 seed in the pre op area using the Neoprobe.  The seed is in the 12 o'clock position of the left breast.   In the holding area, her left areola was injected with 1 millicurie of Technitium Sulfur Colloid.  OPERATIVE NOTE:   The patient was taken to room # 7 at  [facility] where she underwent a general anesthesia  supervised by Anesthesiologist: Catalina Gravel, MD CRNA: Maryella Shivers, CRNA. Her left breast and axilla were prepped with  ChloraPrep and sterilely draped.    A time-out and the surgical check list was reviewed.    I turned attention to the cancer which was about at the 12 o'clock position of the left breast.  [Note:  She had a second biopsy of the UOQ of the left breast which was benign.] I went through a peri-areolar incision, but dissected cranially about 5 cm.   I used the Neoprobe to identify the I131 seed.  I tried to excise an area around the tumor of at least 1 cm.  I excised this block of breast tissue approximately 4 cm by 4 cm  in diameter.   I took the lumpectomy down to the chest wall.  I painted the lumpectomy specimen with the 6 color paint kit and did a specimen mammogram which confirmed the mass, clip, and the seed were all in the right position in the specimen.  The specimen was sent to pathology who called back to confirm that they have the seed and the specimen.   I then started the left deep axillary sentinel lymph node biopsy. I made an incision in the left axilla.  I found a hot area at the junction of the breast and the pectoralis major muscle, deep  in the axilla. I cut down and  identified a hot node that had counts of 600 and the background has 30 counts.  I checked her internal mammary nodes and supraclavicular nodes with the neoprobe and found no other hot area. The axillary node was then sent to pathology.    I then irrigated the wound with saline. I infiltrated approximately 20 mL of 1/4% Marcaine between the incisions. I placed 4 clips to mark biopsy cavity, at 12, 3, 6, and 9 o'clock.  I then closed all the wounds in layers using 3-0 Vicryl sutures for the deep layer. At the skin, I closed the incisions with a 4-0 Monocryl suture. The incisions were then painted with Dermabond.  She had gauze place over the  wounds and the chest placed in a breast binder.   The patient tolerated the procedure well, was transported to the recovery room in good condition. Sponge and needle count were correct at the end of the case.   Final pathology is pending.   Alphonsa Overall, MD, [facility] Pager: [phone] Office phone:  [phone]

## 2016-06-04 NOTE — Anesthesia Preprocedure Evaluation (Signed)
Anesthesia Evaluation  Patient identified by MRN, date of birth, ID band Patient awake    Reviewed: Allergy & Precautions, NPO status , Patient's Chart, lab work & pertinent test results  Airway Mallampati: II  TM Distance: >3 FB Neck ROM: Full    Dental  (+) Teeth Intact, Dental Advisory Given   Pulmonary former smoker,    Pulmonary exam normal breath sounds clear to auscultation       Cardiovascular Exercise Tolerance: Good negative cardio ROS Normal cardiovascular exam Rhythm:Regular Rate:Normal     Neuro/Psych negative neurological ROS  negative psych ROS   GI/Hepatic negative GI ROS, Neg liver ROS,   Endo/Other  negative endocrine ROS  Renal/GU negative Renal ROS     Musculoskeletal negative musculoskeletal ROS (+)   Abdominal   Peds  Hematology negative hematology ROS (+)   Anesthesia Other Findings Day of surgery medications reviewed with the patient.  Left breast cancer  Reproductive/Obstetrics                             Anesthesia Physical Anesthesia Plan  ASA: II  Anesthesia Plan: General   Post-op Pain Management:  Regional for Post-op pain   Induction: Intravenous  Airway Management Planned: LMA  Additional Equipment:   Intra-op Plan:   Post-operative Plan: Extubation in OR  Informed Consent: I have reviewed the patients History and Physical, chart, labs and discussed the procedure including the risks, benefits and alternatives for the proposed anesthesia with the patient or authorized representative who has indicated his/her understanding and acceptance.   Dental advisory given  Plan Discussed with: CRNA  Anesthesia Plan Comments: (GA plus LEFT PEC block.)        Anesthesia Quick Evaluation

## 2016-06-04 NOTE — Anesthesia Procedure Notes (Signed)
Anesthesia Regional Block: Pectoralis block   Pre-Anesthetic Checklist: ,, timeout performed, Correct Patient, Correct Site, Correct Laterality, Correct Procedure, Correct Position, site marked, Risks and benefits discussed,  Surgical consent,  Pre-op evaluation,  At surgeon's request and post-op pain management  Laterality: Left  Prep: chloraprep       Needles:  Injection technique: Single-shot  Needle Type: Echogenic Needle     Needle Length: 9cm  Needle Gauge: 21     Additional Needles:   Procedures: ultrasound guided,,,,,,,,  Narrative:  Start time: 06/04/2016 9:50 AM End time: 06/04/2016 9:55 AM Injection made incrementally with aspirations every 5 mL.  Performed by: Personally  Anesthesiologist: Catalina Gravel  Additional Notes: No pain on injection. No increased resistance to injection. Injection made in 5cc increments.  Good needle visualization.  Patient tolerated procedure well.

## 2016-06-04 NOTE — Progress Notes (Signed)
Assisted Dr. Turk with left, ultrasound guided, pectoralis block. Side rails up, monitors on throughout procedure. See vital signs in flow sheet. Tolerated Procedure well. 

## 2016-06-08 ENCOUNTER — Encounter (HOSPITAL_BASED_OUTPATIENT_CLINIC_OR_DEPARTMENT_OTHER): Payer: Self-pay | Admitting: Surgery

## 2016-06-15 ENCOUNTER — Telehealth: Payer: Self-pay | Admitting: *Deleted

## 2016-06-15 ENCOUNTER — Encounter: Payer: Self-pay | Admitting: Hematology and Oncology

## 2016-06-15 ENCOUNTER — Ambulatory Visit (HOSPITAL_BASED_OUTPATIENT_CLINIC_OR_DEPARTMENT_OTHER): Payer: 59 | Admitting: Hematology and Oncology

## 2016-06-15 DIAGNOSIS — Z170 Estrogen receptor positive status [ER+]: Secondary | ICD-10-CM | POA: Diagnosis not present

## 2016-06-15 DIAGNOSIS — C50212 Malignant neoplasm of upper-inner quadrant of left female breast: Secondary | ICD-10-CM

## 2016-06-15 NOTE — Assessment & Plan Note (Signed)
Left lumpectomy 06/04/2016: IDC grade 1, 1.1 cm, margins negative, 0/6 lymph nodes negative, ER 100%, PR 95%, HER-2 negative ratio 1.68, Ki-67 5%, T1 CN 0 stage IA  Pathology counseling: I discussed the final pathology report of the patient provided  a copy of this report. I discussed the margins as well as lymph node surgeries. We also discussed the final staging along with previously performed ER/PR and HER-2/neu testing.  Recommendation: 1. Oncotype DX testing to determine if chemotherapy would be of any benefit followed by 2. Adjuvant radiation therapy followed by 3. Adjuvant antiestrogen therapy  Return to clinic based upon Oncotype DX test result

## 2016-06-15 NOTE — Telephone Encounter (Signed)
Ordered oncotype per Dr. Gudena.  Faxed requisition to pathology and confirmed receipt. 

## 2016-06-15 NOTE — Progress Notes (Signed)
Patient Care Team: Dibas Dorthy Cooler, MD as PCP - General (Family Medicine) Alphonsa Overall, MD as Consulting Physician (General Surgery) Nicholas Lose, MD as Consulting Physician (Hematology and Oncology) Eppie Gibson, MD as Attending Physician (Radiation Oncology)  DIAGNOSIS:  Encounter Diagnosis  Name Primary?  . Malignant neoplasm of upper-inner quadrant of left breast in female, estrogen receptor positive ([place])     SUMMARY OF ONCOLOGIC HISTORY:   Malignant neoplasm of upper-inner quadrant of left breast in female, estrogen receptor positive ([place])   04/24/2016 Initial Diagnosis    Left breast biopsy 12:00: Benign, left breast biopsy 11:00: IDC low-grade, ER 100%, PR 95%, HER-2 negative ratio 1.68, Ki-67 5%; screening detected left breast distortion and calcifications UIQ 1.1 cm at 11:00 position axilla negative, T1 cN0 stage IA clinical stage      06/04/2016 Surgery    Left lumpectomy: IDC grade 1, 1.1 cm, margins negative, 0/6 lymph nodes negative, ER 100%, PR 95%, HER-2 negative ratio 1.68, Ki-67 5%, T1 CN 0 stage IA       CHIEF COMPLIANT: Follow-up of recent lumpectomy  INTERVAL HISTORY: Candice Caldwell is a 61 year old with above-mentioned history left breast cancer treated with lumpectomy and is here today to discuss the pathology report. She is healing very well from the recent surgery.  REVIEW OF SYSTEMS:   Constitutional: Denies fevers, chills or abnormal weight loss Eyes: Denies blurriness of vision Ears, nose, mouth, throat, and face: Denies mucositis or sore throat Respiratory: Denies cough, dyspnea or wheezes Cardiovascular: Denies palpitation, chest discomfort Gastrointestinal:  Denies nausea, heartburn or change in bowel habits Skin: Denies abnormal skin rashes Lymphatics: Denies new lymphadenopathy or easy bruising Neurological:Denies numbness, tingling or new weaknesses Behavioral/Psych: Mood is stable, no new changes  Extremities: No lower extremity edema Breast:  Left lumpectomy All other systems were reviewed with the patient and are negative.  I have reviewed the past medical history, past surgical history, social history and family history with the patient and they are unchanged from previous note.  ALLERGIES:  has No Known Allergies.  MEDICATIONS:  Current Outpatient Prescriptions  Medication Sig Dispense Refill  . aspirin 81 MG tablet Take 81 mg by mouth daily.    . Black Cohosh 40 MG CAPS Take by mouth.    Marland Kitchen HYDROcodone-acetaminophen (NORCO/VICODIN) 5-325 MG tablet Take 1-2 tablets by mouth every 6 (six) hours as needed. 30 tablet 0  . Multiple Vitamins-Minerals (OCUVITE EXTRA) TABS Take 1 tablet by mouth daily.    . Omega-3 Fatty Acids (FISH OIL CONCENTRATE PO) Take 1 tablet by mouth daily.     No current facility-administered medications for this visit.     PHYSICAL EXAMINATION: ECOG PERFORMANCE STATUS: 1 - Symptomatic but completely ambulatory  Vitals:   06/15/16 1223  BP: (!) 145/65  Pulse: 65  Resp: 18  Temp: 98.5 F (36.9 C)   Filed Weights   06/15/16 1223  Weight: 137 lb 3.2 oz (62.2 kg)    GENERAL:alert, no distress and comfortable SKIN: skin color, texture, turgor are normal, no rashes or significant lesions EYES: normal, Conjunctiva are pink and non-injected, sclera clear OROPHARYNX:no exudate, no erythema and lips, buccal mucosa, and tongue normal  NECK: supple, thyroid normal size, non-tender, without nodularity LYMPH:  no palpable lymphadenopathy in the cervical, axillary or inguinal LUNGS: clear to auscultation and percussion with normal breathing effort HEART: regular rate & rhythm and no murmurs and no lower extremity edema ABDOMEN:abdomen soft, non-tender and normal bowel sounds MUSCULOSKELETAL:no cyanosis of digits and no clubbing  NEURO: alert & oriented x 3 with fluent speech, no focal motor/sensory deficits EXTREMITIES: No lower extremity edema  LABORATORY DATA:  I have reviewed the data as listed    Chemistry      Component Value Date/Time   NA 138 05/06/2016 0822   K 3.9 05/06/2016 0822   CO2 24 05/06/2016 0822   BUN 14.5 05/06/2016 0822   CREATININE 0.8 05/06/2016 0822      Component Value Date/Time   CALCIUM 9.8 05/06/2016 0822   ALKPHOS 79 05/06/2016 0822   AST 24 05/06/2016 0822   ALT 26 05/06/2016 0822   BILITOT 0.47 05/06/2016 0822       Lab Results  Component Value Date   WBC 6.9 05/06/2016   HGB 13.7 05/06/2016   HCT 40.4 05/06/2016   MCV 95.1 05/06/2016   PLT 218 05/06/2016   NEUTROABS 4.6 05/06/2016    ASSESSMENT & PLAN:  Malignant neoplasm of upper-inner quadrant of left breast in female, estrogen receptor positive ([place]) Left lumpectomy 06/04/2016: IDC grade 1, 1.1 cm, margins negative, 0/6 lymph nodes negative, ER 100%, PR 95%, HER-2 negative ratio 1.68, Ki-67 5%, T1 CN 0 stage IA  Pathology counseling: I discussed the final pathology report of the patient provided  a copy of this report. I discussed the margins as well as lymph node surgeries. We also discussed the final staging along with previously performed ER/PR and HER-2/neu testing.  Recommendation: 1. Oncotype DX testing to determine if chemotherapy would be of any benefit followed by 2. Adjuvant radiation therapy followed by 3. Adjuvant antiestrogen therapy  Return to clinic based upon Oncotype DX test result   I spent 25 minutes talking to the patient of which more than half was spent in counseling and coordination of care.  No orders of the defined types were placed in this encounter.  The patient has a good understanding of the overall plan. she agrees with it. she will call with any problems that may develop before the next visit here.   Rulon Eisenmenger, MD 06/15/16

## 2016-06-23 ENCOUNTER — Telehealth: Payer: Self-pay | Admitting: *Deleted

## 2016-06-23 NOTE — Telephone Encounter (Signed)
Received oncotype score of 9/7%. Physician team notified. Called pt and discussed she does not need chemotherapy based on those results. Received verbal understanding. Referral placed for pt to see Dr. Sondra Come.Discussed with pt she will receive a call for an appt with Dr. Sondra Come. Denies further needs.

## 2016-06-24 ENCOUNTER — Encounter: Payer: Self-pay | Admitting: Radiation Oncology

## 2016-06-26 ENCOUNTER — Encounter (HOSPITAL_COMMUNITY): Payer: Self-pay

## 2016-06-26 NOTE — Progress Notes (Signed)
Location of Breast Cancer: upper-inner quadrant of left breast   Histology per Pathology Report:   06/04/16 Diagnosis 1. Breast, lumpectomy, Left - INVASIVE DUCTAL CARCINOMA, GRADE I/III, SPANNING 1.1 CM. - THE SURGICAL RESECTION MARGINS ARE NEGATIVE FOR CARCINOMA. - SEE ONCOLOGY TABLE BELOW. 2. Lymph node, sentinel, biopsy, Left - THERE IS NO EVIDENCE OF CARCINOMA IN 1 OF 1 LYMPH NODE (0/1). 3. Lymph node, sentinel, biopsy, Left - THERE IS NO EVIDENCE OF CARCINOMA IN 1 OF 1 LYMPH NODE (0/1). 4. Lymph node, sentinel, biopsy, Left - THERE IS NO EVIDENCE OF CARCINOMA IN 1 OF 1 LYMPH NODE (0/1). 5. Lymph node, sentinel, biopsy, Left - THERE IS NO EVIDENCE OF CARCINOMA IN 1 OF 1 LYMPH NODE (0/1). 6. Lymph node, sentinel, biopsy, Left - THERE IS NO EVIDENCE OF CARCINOMA IN 1 OF 1 LYMPH NODE (0/1). 7. Lymph node, sentinel, biopsy, Left - THERE IS NO EVIDENCE OF CARCINOMA IN 1 OF 1 LYMPH NODE (0/1).  04/24/16 Diagnosis 1. Breast, left, needle core biopsy, 12:00 o'clock - BREAST TISSUE WITH BENIGN CHANGES. NO MALIGNANCY IDENTIFIED. - SEE COMMENT. 2. Breast, left, needle core biopsy, 11:00 o'clock - INVASIVE DUCTAL CARCINOMA, LOW GRADE. - SEE COMMENT.  Receptor Status: ER(100%), PR (95%), Her2-neu (negative), Ki-(5%)  Did patient present with symptoms (if so, please note symptoms) or was this found on screening mammography?: screening mammogram  Past/Anticipated interventions by surgeon, if IRW:[id number]: 06/04/16 - LEFT BREAST LUMPECTOMY WITH RADIOACTIVE SEED AND SENTINEL LYMPH NODE BIOPSY;  Surgeon: Alphonsa Overall, MD  Past/Anticipated interventions by medical oncology, if any: Adjuvant radiation therapy followed by adjuvant antiestrogen therapy.  Lymphedema issues, if any:  no   Pain issues, if any:  no   SAFETY ISSUES:  Prior radiation? no  Pacemaker/ICD? no  Possible current pregnancy?no  Is the patient on methotrexate? no  Current Complaints / other details:  Patient  mentioned that she had a spinal fusion and felt stabbing pains under her left shoulder during a recent MRI.  BP 112/69 (BP Location: Left Arm, Patient Position: Sitting)   Pulse 61   Temp 97.8 F (36.6 C) (Oral)   Ht 5' 8"  (1.727 m)   Wt 135 lb 12.8 oz (61.6 kg)   SpO2 99%   BMI 20.65 kg/m    Wt Readings from Last 3 Encounters:  06/29/16 135 lb 12.8 oz (61.6 kg)  06/15/16 137 lb 3.2 oz (62.2 kg)  06/04/16 137 lb (62.1 kg)      Hess, Craige Cotta, RN 06/26/2016,8:49 AM

## 2016-06-29 ENCOUNTER — Ambulatory Visit
Admission: RE | Admit: 2016-06-29 | Discharge: 2016-06-29 | Disposition: A | Discharge status: Home / Self Care | Payer: 59 | Source: Ambulatory Visit | Attending: Radiation Oncology | Admitting: Radiation Oncology

## 2016-06-29 ENCOUNTER — Encounter: Payer: Self-pay | Admitting: Radiation Oncology

## 2016-06-29 DIAGNOSIS — Z981 Arthrodesis status: Secondary | ICD-10-CM | POA: Diagnosis not present

## 2016-06-29 DIAGNOSIS — Z87891 Personal history of nicotine dependence: Secondary | ICD-10-CM | POA: Insufficient documentation

## 2016-06-29 DIAGNOSIS — Z7982 Long term (current) use of aspirin: Secondary | ICD-10-CM | POA: Diagnosis not present

## 2016-06-29 DIAGNOSIS — Z170 Estrogen receptor positive status [ER+]: Secondary | ICD-10-CM | POA: Diagnosis not present

## 2016-06-29 DIAGNOSIS — Z510 Encounter for antineoplastic radiation therapy: Secondary | ICD-10-CM | POA: Diagnosis not present

## 2016-06-29 DIAGNOSIS — C50212 Malignant neoplasm of upper-inner quadrant of left female breast: Secondary | ICD-10-CM | POA: Diagnosis not present

## 2016-06-29 NOTE — Progress Notes (Signed)
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   HISTORY OF PRESENT ILLNESS::Candice Caldwell is a 61 y.o. female who was originally seen in our multidisciplinary breast clinic on 05/06/16. Since our last visit, the patient underwent left breast lumpectomy with sentinel node biopsy on 06/04/16 with Dr. Lucia Gaskins. Final pathology showed invasive ductal carcinoma, grade I/III, spanning 1.1 cm, ER 100% / PR 95% / HER-2 negative / Ki-67 5%. Surgical margins negative for carcinoma. 0 out of 6 lymph nodes were positive for malignancy.   The patient is here to further discuss radiation treatment options for the management of her disease.   PREVIOUS RADIATION THERAPY: No  PAST MEDICAL HISTORY:  has a past medical history of Cancer ([place]) (04/2016).    PAST SURGICAL HISTORY: Past Surgical History:  Procedure Laterality Date  . BREAST LUMPECTOMY WITH RADIOACTIVE SEED AND SENTINEL LYMPH NODE BIOPSY Left 06/04/2016   Procedure: LEFT BREAST LUMPECTOMY WITH RADIOACTIVE SEED AND SENTINEL LYMPH NODE BIOPSY;  Surgeon: Alphonsa Overall, MD;  Location: [facility];  Service: General;  Laterality: Left;  . EYE SURGERY     lasik  . SPINAL FUSION      FAMILY HISTORY: family history is not on file.  SOCIAL HISTORY:  reports that she quit smoking about 1 months ago. Her smoking use included Cigarettes. She has never used smokeless tobacco. She reports that she drinks alcohol. She reports that she does not use drugs.  ALLERGIES: Patient has no  known allergies.  MEDICATIONS:  Current Outpatient Prescriptions  Medication Sig Dispense Refill  . aspirin 81 MG tablet Take 81 mg by mouth daily.    . Black Cohosh 40 MG CAPS Take by mouth.    . Multiple Vitamins-Minerals (OCUVITE EXTRA) TABS Take 1 tablet by mouth daily.    . Omega-3 Fatty Acids (FISH OIL CONCENTRATE PO) Take 1 tablet by mouth daily.    Marland Kitchen HYDROcodone-acetaminophen (NORCO/VICODIN) 5-325 MG tablet Take 1-2 tablets by mouth every 6 (six) hours as needed. (Patient not taking: Reported on 06/29/2016) 30 tablet 0   No current facility-administered medications for this encounter.     REVIEW OF SYSTEMS:  On review of systems, the patient reports that she is doing well overall. She denies any lymphedema or pain issues. She denies any chest pain, shortness of breath, cough, fevers, chills, night sweats, unintended weight changes. She denies any bowel or bladder disturbances, and denies abdominal pain, nausea or vomiting. Patient mentioned that she had a spinal fusion and felt stabbing pains under her left shoulder during a recent MRI. She has no issue lifting her arms. She does report slight numbness of the upper left arm. A complete review of systems is obtained and is otherwise negative.   PHYSICAL EXAM:  height is 5' 8"  (1.727 m) and weight is 135 lb 12.8 oz (61.6 kg). Her oral temperature is 97.8 F (36.6 C). Her blood pressure is 112/69 and her pulse is 61. Her oxygen saturation is 99%.   General: Alert and oriented, in no acute distress HEENT: Head is normocephalic. Extraocular  movements are intact. Oropharynx is clear. Neck: Neck is supple, no palpable cervical or supraclavicular lymphadenopathy. Heart: Regular in rate and rhythm with no murmurs, rubs, or gallops. Chest: Clear to auscultation bilaterally, with no rhonchi, wheezes, or rales. Breast: Left breast shows well healing periareolar scar with dermabond in place and a separate scar in the axillary region from sentinel  node biopsy. No signs of drainage or infection.   LABORATORY DATA:  Lab Results  Component Value Date   WBC 6.9 05/06/2016   HGB 13.7 05/06/2016   HCT 40.4 05/06/2016   MCV 95.1 05/06/2016   PLT 218 05/06/2016   NEUTROABS 4.6 05/06/2016   Lab Results  Component Value Date   NA 138 05/06/2016   K 3.9 05/06/2016   CO2 24 05/06/2016   GLUCOSE 114 05/06/2016   CREATININE 0.8 05/06/2016   CALCIUM 9.8 05/06/2016      RADIOGRAPHY: Nm Sentinel Node Inj-no Rpt (breast)  Result Date: 06/16/2016 Sulfur colloid was injected intradermally by the nuclear medicine technologist for breast cancer sentinel node localization  Mm Breast Surgical Specimen  Result Date: 06/04/2016 CLINICAL DATA:  Evaluate specimen EXAM: SPECIMEN RADIOGRAPH OF THE LEFT BREAST COMPARISON:  Previous exam(s). FINDINGS: Status post excision of the left breast. The radioactive seed and biopsy marker clip are present, completely intact, and were marked for pathology. IMPRESSION: Specimen radiograph of the left breast. Electronically Signed   By: Dorise Bullion III M.D   On: 06/04/2016 11:07   Mm Lt Radioactive Seed Loc Mammo Guide  Result Date: 06/01/2016 CLINICAL DATA:  Patient with a left breast cancer scheduled for lumpectomy requiring preoperative radioactive seed localization. EXAM: MAMMOGRAPHIC GUIDED RADIOACTIVE SEED LOCALIZATION OF THE LEFT BREAST COMPARISON:  Previous exam(s). FINDINGS: Patient presents for radioactive seed localization prior to breast conservation surgery. I met with the patient and we discussed the procedure of seed localization including benefits and alternatives. We discussed the high likelihood of a successful procedure. We discussed the risks of the procedure including infection, bleeding, tissue injury and further surgery. We discussed the low dose of radioactivity involved in the procedure. Informed, written consent was given. The usual time-out protocol was performed immediately prior to the  procedure. Using mammographic guidance, sterile technique, 1% lidocaine and an I-125 radioactive seed, the ribbon shaped clip within the upper inner quadrant of the left breast, 11 o'clock axis, was localized using a medial approach. The follow-up mammogram images confirm the seed in the expected location and were marked for Dr. Lucia Gaskins. Follow-up survey of the patient confirms presence of the radioactive seed. Order number of I-125 seed:  [id number]. Total activity:  4.268 millicuries  Reference Date: 05/07/2016 The patient tolerated the procedure well and was released from the [facility]. She was given instructions regarding seed removal. IMPRESSION: Radioactive seed localization left breast. No apparent complications. The radioactive seed was placed 5 mm posterior and 9 mm medial to the ribbon shaped clip, corresponding to the site of patient's architectural distortion as described on the earlier postprocedure mammogram. These results were called by telephone at the time of interpretation on 06/01/2016 at 2:10 pm to Dr. Alphonsa Overall . Electronically Signed   By: Franki Cabot M.D.   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 She will be an excellent candidate for breast conservation therapy with radiation therapy directed at the left breast.  PLAN: Today, I talked  to the patient about the findings and work-up thus far.  We again discussed the natural history of low grade invasive ductal carcinoma of the left breast and general treatment, highlighting the role of radiotherapy in the management.  We discussed the available radiation techniques, and focused on the details of logistics and delivery.  We reviewed the anticipated acute and late sequelae associated with radiation in this setting.  The patient was encouraged to ask questions that I answered to the best of my ability. The patient  would like to proceed with radiation and will be scheduled for CT simulation.   I spent 60 minutes minutes face to face with the patient and more than 50% of that time was spent in counseling and/or coordination of care.    ------------------------------------------------  Blair Promise, PhD, MD  This document serves as a record of services personally performed by Gery Pray, MD. It was created on his behalf by Arlyce Harman, a trained medical scribe. The creation of this record is based on the scribe's personal observations and the provider's statements to them. This document has been checked and approved by the attending provider.

## 2016-07-01 ENCOUNTER — Telehealth: Payer: Self-pay | Admitting: Hematology and Oncology

## 2016-07-01 NOTE — Telephone Encounter (Signed)
sw pt to confirm 6/28 appt at 330 per LOS

## 2016-07-02 ENCOUNTER — Ambulatory Visit
Admission: RE | Admit: 2016-07-02 | Discharge: 2016-07-02 | Disposition: A | Discharge status: Home / Self Care | Payer: 59 | Source: Ambulatory Visit | Attending: Radiation Oncology | Admitting: Radiation Oncology

## 2016-07-02 DIAGNOSIS — Z510 Encounter for antineoplastic radiation therapy: Secondary | ICD-10-CM | POA: Diagnosis not present

## 2016-07-02 DIAGNOSIS — C50212 Malignant neoplasm of upper-inner quadrant of left female breast: Secondary | ICD-10-CM

## 2016-07-02 DIAGNOSIS — Z170 Estrogen receptor positive status [ER+]: Principal | ICD-10-CM

## 2016-07-07 DIAGNOSIS — Z510 Encounter for antineoplastic radiation therapy: Secondary | ICD-10-CM | POA: Diagnosis not present

## 2016-07-07 NOTE — Progress Notes (Signed)
  Radiation Oncology         ([phone] ________________________________  Name: Candice Caldwell MRN: [id number]  Date: 07/02/2016  DOB: 04-Feb-1956  SIMULATION AND TREATMENT PLANNING NOTE    ICD-9-CM ICD-10-CM   1. 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  Identity was confirmed.  All relevant records and images related to the planned course of therapy were reviewed.  The patient freely provided informed written consent to proceed with treatment after reviewing the details related to the planned course of therapy. The consent form was witnessed and verified by the simulation staff.  Then, the patient was set-up in a stable reproducible  supine position for radiation therapy.  CT images were obtained.  Surface markings were placed.  The CT images were loaded into the planning software.  Then the target and avoidance structures were contoured.  Treatment planning then occurred.  The radiation prescription was entered and confirmed.  Then, I designed and supervised the construction of a total of 3 medically necessary complex treatment devices.  I have requested : 3D Simulation  I have requested a DVH of the following structures: heart, lungs, lumpectomy cavity..  I have ordered:CBC  PLAN:  The patient will receive 42.5 Gy in 16 fractions Followed by a boost to the lumpectomy cavity of 10 gray for a cumulative dose of 52.5 gray. The patient will be treated with hypo-fractionated accelerated radiation therapy assuming dose constraints were met.    -----------------------------------  Blair Promise, PhD, MD

## 2016-07-09 ENCOUNTER — Ambulatory Visit
Admission: RE | Admit: 2016-07-09 | Discharge: 2016-07-09 | Disposition: A | Discharge status: Home / Self Care | Payer: 59 | Source: Ambulatory Visit | Attending: Radiation Oncology | Admitting: Radiation Oncology

## 2016-07-09 DIAGNOSIS — Z170 Estrogen receptor positive status [ER+]: Principal | ICD-10-CM

## 2016-07-09 DIAGNOSIS — Z510 Encounter for antineoplastic radiation therapy: Secondary | ICD-10-CM | POA: Diagnosis not present

## 2016-07-09 DIAGNOSIS — C50212 Malignant neoplasm of upper-inner quadrant of left female breast: Secondary | ICD-10-CM

## 2016-07-09 NOTE — Progress Notes (Signed)
  Radiation Oncology         [phone] ________________________________  Name: Candice Caldwell MRN: [id number]  Date: 07/09/2016  DOB: 1955/10/04  Simulation Verification Note    ICD-9-CM ICD-10-CM   1. Malignant neoplasm of upper-inner quadrant of left breast in female, estrogen receptor positive ([employer]) 174.2 C50.212    V86.0 Z17.0     Status: outpatient  NARRATIVE: The patient was brought to the treatment unit and placed in the planned treatment position. The clinical setup was verified. Then port films were obtained and uploaded to the radiation oncology medical record software.  The treatment beams were carefully compared against the planned radiation fields. The position location and shape of the radiation fields was reviewed. They targeted volume of tissue appears to be appropriately covered by the radiation beams. Organs at risk appear to be excluded as planned.  Based on my personal review, I approved the simulation verification. The patient's treatment will proceed as planned.  -----------------------------------  Blair Promise, PhD, MD

## 2016-07-10 ENCOUNTER — Telehealth: Payer: Self-pay | Admitting: Oncology

## 2016-07-10 NOTE — Telephone Encounter (Signed)
Received message from patient asking if it is OK to take Keflex while getting radiation.  She cut her finger and had to get 5 stiches.  Advised her that it is OK to take Keflex.

## 2016-07-13 ENCOUNTER — Ambulatory Visit
Admission: RE | Admit: 2016-07-13 | Discharge: 2016-07-13 | Disposition: A | Discharge status: Home / Self Care | Payer: 59 | Source: Ambulatory Visit | Attending: Radiation Oncology | Admitting: Radiation Oncology

## 2016-07-13 DIAGNOSIS — Z510 Encounter for antineoplastic radiation therapy: Secondary | ICD-10-CM | POA: Diagnosis not present

## 2016-07-14 ENCOUNTER — Inpatient Hospital Stay
Admission: RE | Admit: 2016-07-14 | Discharge: 2016-07-14 | Disposition: A | Discharge status: Home / Self Care | Payer: Self-pay | Source: Ambulatory Visit | Attending: Radiation Oncology | Admitting: Radiation Oncology

## 2016-07-14 ENCOUNTER — Ambulatory Visit
Admission: RE | Admit: 2016-07-14 | Discharge: 2016-07-14 | Disposition: A | Discharge status: Home / Self Care | Payer: 59 | Source: Ambulatory Visit | Attending: Radiation Oncology | Admitting: Radiation Oncology

## 2016-07-14 DIAGNOSIS — Z170 Estrogen receptor positive status [ER+]: Principal | ICD-10-CM

## 2016-07-14 DIAGNOSIS — Z510 Encounter for antineoplastic radiation therapy: Secondary | ICD-10-CM | POA: Diagnosis not present

## 2016-07-14 DIAGNOSIS — C50212 Malignant neoplasm of upper-inner quadrant of left female breast: Secondary | ICD-10-CM

## 2016-07-14 MED ORDER — RADIAPLEXRX EX GEL
Freq: Once | CUTANEOUS | Status: AC
  Administered 2016-07-14: 17:00:00 via TOPICAL

## 2016-07-14 NOTE — Progress Notes (Signed)
Pt here for patient teaching.  Pt given Radiation and You booklet, skin care instructions and Radiaplex gel.  Reviewed areas of pertinence such as fatigue, skin changes, breast tenderness and breast swelling . Pt able to give teach back of to pat skin and use unscented/gentle soap,apply Radiaplex bid, avoid applying anything to skin within 4 hours of treatment and to use an electric razor if they must shave. Pt demonstrated understanding and verbalizes understanding of information given and will contact nursing with any questions or concerns.

## 2016-07-15 ENCOUNTER — Ambulatory Visit
Admission: RE | Admit: 2016-07-15 | Discharge: 2016-07-15 | Disposition: A | Discharge status: Home / Self Care | Payer: 59 | Source: Ambulatory Visit | Attending: Radiation Oncology | Admitting: Radiation Oncology

## 2016-07-15 DIAGNOSIS — Z510 Encounter for antineoplastic radiation therapy: Secondary | ICD-10-CM | POA: Diagnosis not present

## 2016-07-16 ENCOUNTER — Ambulatory Visit
Admission: RE | Admit: 2016-07-16 | Discharge: 2016-07-16 | Disposition: A | Discharge status: Home / Self Care | Payer: 59 | Source: Ambulatory Visit | Attending: Radiation Oncology | Admitting: Radiation Oncology

## 2016-07-16 DIAGNOSIS — Z510 Encounter for antineoplastic radiation therapy: Secondary | ICD-10-CM | POA: Diagnosis not present

## 2016-07-17 ENCOUNTER — Ambulatory Visit
Admission: RE | Admit: 2016-07-17 | Discharge: 2016-07-17 | Disposition: A | Discharge status: Home / Self Care | Payer: 59 | Source: Ambulatory Visit | Attending: Radiation Oncology | Admitting: Radiation Oncology

## 2016-07-17 DIAGNOSIS — Z510 Encounter for antineoplastic radiation therapy: Secondary | ICD-10-CM | POA: Diagnosis not present

## 2016-07-20 ENCOUNTER — Ambulatory Visit
Admission: RE | Admit: 2016-07-20 | Discharge: 2016-07-20 | Disposition: A | Discharge status: Home / Self Care | Payer: 59 | Source: Ambulatory Visit | Attending: Radiation Oncology | Admitting: Radiation Oncology

## 2016-07-20 DIAGNOSIS — Z510 Encounter for antineoplastic radiation therapy: Secondary | ICD-10-CM | POA: Diagnosis not present

## 2016-07-21 ENCOUNTER — Ambulatory Visit
Admission: RE | Admit: 2016-07-21 | Discharge: 2016-07-21 | Disposition: A | Discharge status: Home / Self Care | Payer: 59 | Source: Ambulatory Visit | Attending: Radiation Oncology | Admitting: Radiation Oncology

## 2016-07-21 DIAGNOSIS — Z510 Encounter for antineoplastic radiation therapy: Secondary | ICD-10-CM | POA: Diagnosis not present

## 2016-07-22 ENCOUNTER — Ambulatory Visit
Admission: RE | Admit: 2016-07-22 | Discharge: 2016-07-22 | Disposition: A | Discharge status: Home / Self Care | Payer: 59 | Source: Ambulatory Visit | Attending: Radiation Oncology | Admitting: Radiation Oncology

## 2016-07-22 DIAGNOSIS — Z510 Encounter for antineoplastic radiation therapy: Secondary | ICD-10-CM | POA: Diagnosis not present

## 2016-07-23 ENCOUNTER — Ambulatory Visit
Admission: RE | Admit: 2016-07-23 | Discharge: 2016-07-23 | Disposition: A | Discharge status: Home / Self Care | Payer: 59 | Source: Ambulatory Visit | Attending: Radiation Oncology | Admitting: Radiation Oncology

## 2016-07-23 DIAGNOSIS — Z510 Encounter for antineoplastic radiation therapy: Secondary | ICD-10-CM | POA: Diagnosis not present

## 2016-07-24 ENCOUNTER — Ambulatory Visit
Admission: RE | Admit: 2016-07-24 | Discharge: 2016-07-24 | Disposition: A | Discharge status: Home / Self Care | Payer: 59 | Source: Ambulatory Visit | Attending: Radiation Oncology | Admitting: Radiation Oncology

## 2016-07-24 DIAGNOSIS — Z510 Encounter for antineoplastic radiation therapy: Secondary | ICD-10-CM | POA: Diagnosis not present

## 2016-07-27 ENCOUNTER — Ambulatory Visit
Admission: RE | Admit: 2016-07-27 | Discharge: 2016-07-27 | Disposition: A | Discharge status: Home / Self Care | Payer: 59 | Source: Ambulatory Visit | Attending: Radiation Oncology | Admitting: Radiation Oncology

## 2016-07-27 DIAGNOSIS — Z510 Encounter for antineoplastic radiation therapy: Secondary | ICD-10-CM | POA: Diagnosis not present

## 2016-07-28 ENCOUNTER — Ambulatory Visit
Admission: RE | Admit: 2016-07-28 | Discharge: 2016-07-28 | Disposition: A | Discharge status: Home / Self Care | Payer: 59 | Source: Ambulatory Visit | Attending: Radiation Oncology | Admitting: Radiation Oncology

## 2016-07-28 ENCOUNTER — Ambulatory Visit: Admission: RE | Admit: 2016-07-28 | Payer: 59 | Source: Ambulatory Visit | Admitting: Radiation Oncology

## 2016-07-28 ENCOUNTER — Telehealth: Payer: Self-pay | Admitting: Hematology and Oncology

## 2016-07-28 VITALS — BP 105/70 | HR 87 | Temp 98.5°F | Resp 18 | Wt 135.8 lb

## 2016-07-28 DIAGNOSIS — Z170 Estrogen receptor positive status [ER+]: Principal | ICD-10-CM

## 2016-07-28 DIAGNOSIS — Z510 Encounter for antineoplastic radiation therapy: Secondary | ICD-10-CM | POA: Diagnosis not present

## 2016-07-28 DIAGNOSIS — C50212 Malignant neoplasm of upper-inner quadrant of left female breast: Secondary | ICD-10-CM

## 2016-07-28 NOTE — Progress Notes (Signed)
  Radiation Oncology         [phone] ________________________________  Name: Candice Caldwell MRN: [id number]  Date: 07/28/2016  DOB: [birth year]  Weekly Radiation Therapy Management    ICD-9-CM ICD-10-CM   1. Malignant neoplasm of upper-inner quadrant of left breast in female, estrogen receptor positive ([place]) 174.2 C50.212    V86.0 Z17.0      Current Dose: 31.92 Gy     Planned Dose:  52.56 Gy  Narrative . . . . . . . . The patient presents for routine under treatment assessment.                                   Candice Caldwell completed 12th fraction of radiation to left breast.  Patient denies having any pain but does report some soreness.  Patient denies having any issues with energy, reports being very active.  Patient reports appetite is good.  Patient states she has just now started using Radiaplex, plans to use twice a day.  Skin to left breast is clean/dry/intact with some redness present.                                 Set-up films were reviewed.                                 The chart was checked. Physical Findings. . .  weight is 135 lb 12.8 oz (61.6 kg). Her oral temperature is 98.5 F (36.9 C). Her blood pressure is 105/70 and her pulse is 87. Her respiration is 18 and oxygen saturation is 97%. . Weight essentially stable.  Erythema and hyperpigmentation changes noted in the breast without skin breakdown Impression . . . . . . . The patient is tolerating radiation. She hicked 6 miles earlier today. Plan . . . . . . . . . . . . Continue treatment as planned.  ________________________________   Blair Promise, PhD, MD

## 2016-07-28 NOTE — Progress Notes (Signed)
Candice Caldwell completed 12th fraction of radiation to left breast.  Patient denies having any pain but does report some soreness.  Patient denies having any issues with energy, reports being very active.  Patient reports appetite is good.  Patient states she has just now started using Radiaplex, plans to use twice a day.  Skin to left breast is clean/dry/intact with some redness present.  Vitals:   07/28/16 1618  BP: 105/70  Pulse: 87  Resp: 18  Temp: 98.5 F (36.9 C)  TempSrc: Oral  SpO2: 97%  Weight: 135 lb 12.8 oz (61.6 kg)     Wt Readings from Last 3 Encounters:  07/28/16 135 lb 12.8 oz (61.6 kg)  06/29/16 135 lb 12.8 oz (61.6 kg)  06/15/16 137 lb 3.2 oz (62.2 kg)

## 2016-07-28 NOTE — Telephone Encounter (Signed)
Faxed records to [facility] [phone]

## 2016-07-29 ENCOUNTER — Ambulatory Visit
Admission: RE | Admit: 2016-07-29 | Discharge: 2016-07-29 | Disposition: A | Discharge status: Home / Self Care | Payer: 59 | Source: Ambulatory Visit | Attending: Radiation Oncology | Admitting: Radiation Oncology

## 2016-07-29 DIAGNOSIS — Z170 Estrogen receptor positive status [ER+]: Principal | ICD-10-CM

## 2016-07-29 DIAGNOSIS — C50212 Malignant neoplasm of upper-inner quadrant of left female breast: Secondary | ICD-10-CM

## 2016-07-29 DIAGNOSIS — Z510 Encounter for antineoplastic radiation therapy: Secondary | ICD-10-CM | POA: Diagnosis not present

## 2016-07-29 NOTE — Progress Notes (Signed)
  Radiation Oncology         (336) [phone] ________________________________  Name: Candice Caldwell MRN: [id number]  Date: 07/29/2016  DOB: 08/19/1955  Electron Simulation Note    ICD-9-CM ICD-10-CM   1. Malignant neoplasm of upper-inner quadrant of left breast in female, estrogen receptor positive ([place]) 174.2 C50.212    V86.0 Z17.0     Status: outpatient  NARRATIVE: The patient was brought to the treatment unit and placed in the planned treatment position. The clinical setup was verified. The patient then had set up of her custom electron cutout field encompassing the right mastectomy scar area. The patient will be treated with 9MeV electrons.. A special port plan is requested for treatment.  PLAN: The patient will receive 5 treatments for a boost dose of 10 Gy and cumulative dose of 52.56 Gy. -----------------------------------  Blair Promise, PhD, MD

## 2016-07-30 ENCOUNTER — Ambulatory Visit
Admission: RE | Admit: 2016-07-30 | Discharge: 2016-07-30 | Disposition: A | Discharge status: Home / Self Care | Payer: 59 | Source: Ambulatory Visit | Attending: Radiation Oncology | Admitting: Radiation Oncology

## 2016-07-30 DIAGNOSIS — Z510 Encounter for antineoplastic radiation therapy: Secondary | ICD-10-CM | POA: Diagnosis not present

## 2016-07-31 ENCOUNTER — Ambulatory Visit
Admission: RE | Admit: 2016-07-31 | Discharge: 2016-07-31 | Disposition: A | Discharge status: Home / Self Care | Payer: 59 | Source: Ambulatory Visit | Attending: Radiation Oncology | Admitting: Radiation Oncology

## 2016-07-31 DIAGNOSIS — Z510 Encounter for antineoplastic radiation therapy: Secondary | ICD-10-CM | POA: Diagnosis not present

## 2016-08-04 ENCOUNTER — Ambulatory Visit
Admission: RE | Admit: 2016-08-04 | Discharge: 2016-08-04 | Disposition: A | Discharge status: Home / Self Care | Payer: 59 | Source: Ambulatory Visit | Attending: Radiation Oncology | Admitting: Radiation Oncology

## 2016-08-04 VITALS — BP 131/78 | HR 45 | Temp 98.0°F | Ht 68.0 in | Wt 136.6 lb

## 2016-08-04 DIAGNOSIS — Z510 Encounter for antineoplastic radiation therapy: Secondary | ICD-10-CM | POA: Diagnosis not present

## 2016-08-04 DIAGNOSIS — Z170 Estrogen receptor positive status [ER+]: Principal | ICD-10-CM

## 2016-08-04 DIAGNOSIS — C50212 Malignant neoplasm of upper-inner quadrant of left female breast: Secondary | ICD-10-CM

## 2016-08-04 NOTE — Progress Notes (Signed)
  Radiation Oncology         [phone]) [phone] ________________________________  Name: Candice Caldwell MRN: [id number]  Date: 08/04/2016  DOB: 1955-08-30  Weekly Radiation Therapy Management    ICD-9-CM ICD-10-CM   1. Malignant neoplasm of upper-inner quadrant of left breast in female, estrogen receptor positive ([place]) 174.2 C50.212    V86.0 Z17.0      Current Dose: 42.56 Gy     Planned Dose:  52.56 Gy  Narrative . . . . . . . . The patient presents for routine under treatment assessment.                                   The patient is without complaint.  continues to be very active.                                 Set-up films were reviewed.                                 The chart was checked. Physical Findings. . .  height is 5\' 8"  (1.727 m) and weight is 136 lb 9.6 oz (62 kg). Her oral temperature is 98 F (36.7 C). Her blood pressure is 131/78 and her pulse is 45 (abnormal). Her oxygen saturation is 100%. . Weight essentially stable. The lungs are clear. The heart has a regular rhythm and rate. The left breast area shows hyperpigmentation changes and erythema. No moist desquamation Impression . . . . . . . The patient is tolerating radiation. Plan . . . . . . . . . . . . Continue treatment as planned.  ________________________________     Blair Promise, PhD, MD

## 2016-08-04 NOTE — Progress Notes (Signed)
Candice Caldwell has completed 16 fractions to her left breast.  She denies having any pain or fatigue.  She is using radiaplex.  Her heart rate was low today at 45.  The skin on her left breast is red.  BP 131/78 (BP Location: Right Arm, Patient Position: Sitting)   Pulse (!) 45   Temp 98 F (36.7 C) (Oral)   Ht 5\' 8"  (1.727 m)   Wt 136 lb 9.6 oz (62 kg)   SpO2 100%   BMI 20.77 kg/m    Wt Readings from Last 3 Encounters:  08/04/16 136 lb 9.6 oz (62 kg)  07/28/16 135 lb 12.8 oz (61.6 kg)  06/29/16 135 lb 12.8 oz (61.6 kg)

## 2016-08-05 ENCOUNTER — Ambulatory Visit
Admission: RE | Admit: 2016-08-05 | Discharge: 2016-08-05 | Disposition: A | Discharge status: Home / Self Care | Payer: 59 | Source: Ambulatory Visit | Attending: Radiation Oncology | Admitting: Radiation Oncology

## 2016-08-05 DIAGNOSIS — Z510 Encounter for antineoplastic radiation therapy: Secondary | ICD-10-CM | POA: Diagnosis not present

## 2016-08-06 ENCOUNTER — Ambulatory Visit
Admission: RE | Admit: 2016-08-06 | Discharge: 2016-08-06 | Disposition: A | Discharge status: Home / Self Care | Payer: 59 | Source: Ambulatory Visit | Attending: Radiation Oncology | Admitting: Radiation Oncology

## 2016-08-06 DIAGNOSIS — Z510 Encounter for antineoplastic radiation therapy: Secondary | ICD-10-CM | POA: Diagnosis not present

## 2016-08-07 ENCOUNTER — Ambulatory Visit
Admission: RE | Admit: 2016-08-07 | Discharge: 2016-08-07 | Disposition: A | Discharge status: Home / Self Care | Payer: 59 | Source: Ambulatory Visit | Attending: Radiation Oncology | Admitting: Radiation Oncology

## 2016-08-07 DIAGNOSIS — Z510 Encounter for antineoplastic radiation therapy: Secondary | ICD-10-CM | POA: Diagnosis not present

## 2016-08-10 ENCOUNTER — Ambulatory Visit
Admission: RE | Admit: 2016-08-10 | Discharge: 2016-08-10 | Disposition: A | Discharge status: Home / Self Care | Payer: 59 | Source: Ambulatory Visit | Attending: Radiation Oncology | Admitting: Radiation Oncology

## 2016-08-10 DIAGNOSIS — Z510 Encounter for antineoplastic radiation therapy: Secondary | ICD-10-CM | POA: Diagnosis not present

## 2016-08-11 ENCOUNTER — Ambulatory Visit
Admission: RE | Admit: 2016-08-11 | Discharge: 2016-08-11 | Disposition: A | Discharge status: Home / Self Care | Payer: 59 | Source: Ambulatory Visit | Attending: Radiation Oncology | Admitting: Radiation Oncology

## 2016-08-11 ENCOUNTER — Telehealth: Payer: Self-pay | Admitting: *Deleted

## 2016-08-11 ENCOUNTER — Encounter: Payer: Self-pay | Admitting: Radiation Oncology

## 2016-08-11 VITALS — BP 138/81 | HR 58 | Temp 98.8°F | Wt 136.2 lb

## 2016-08-11 DIAGNOSIS — C50212 Malignant neoplasm of upper-inner quadrant of left female breast: Secondary | ICD-10-CM

## 2016-08-11 DIAGNOSIS — Z510 Encounter for antineoplastic radiation therapy: Secondary | ICD-10-CM | POA: Diagnosis not present

## 2016-08-11 DIAGNOSIS — Z170 Estrogen receptor positive status [ER+]: Principal | ICD-10-CM

## 2016-08-11 NOTE — Telephone Encounter (Signed)
Left message to follow up post XRT.

## 2016-08-11 NOTE — Progress Notes (Addendum)
Candice Caldwell presents for her final fraction of radiation to her Left Breast. She denies pain. She has some mild fatigue. Her Left Breast is red and hyperpigmented. She does have some peeling underneath her Left Breast. She is using Radiaplex two to three times daily. She received a follow up card for a one month appointment with Dr. Sondra Come  BP 138/81   Pulse (!) 58   Temp 98.8 F (37.1 C)   Wt 136 lb 3.2 oz (61.8 kg)   SpO2 100% Comment: room air  BMI 20.71 kg/m    Wt Readings from Last 3 Encounters:  08/11/16 136 lb 3.2 oz (61.8 kg)  08/04/16 136 lb 9.6 oz (62 kg)  07/28/16 135 lb 12.8 oz (61.6 kg)

## 2016-08-11 NOTE — Progress Notes (Signed)
  Radiation Oncology         [phone] ________________________________  Name: Shantoya Geurts MRN: [id number]  Date: 08/11/2016  DOB: August 21, 1955  Weekly Radiation Therapy Management    ICD-9-CM ICD-10-CM   1. Malignant neoplasm of upper-inner quadrant of left breast in female, estrogen receptor positive ([place]) 174.2 C50.212    V86.0 Z17.0      Current Dose: 52.56 Gy     Planned Dose:  52.56 Gy  Narrative . . . . . . . . The patient presents for routine under treatment assessment.                             Ms. Mitnick presents for her final fraction of radiation to her Left Breast. She denies pain. She has some mild fatigue.  She is using Radiaplex two to three times daily. Continues to be very active with several mile hikes during the day.                                 Set-up films were reviewed.                                 The chart was checked. Physical Findings. . .  weight is 136 lb 3.2 oz (61.8 kg). Her temperature is 98.8 F (37.1 C). Her blood pressure is 138/81 and her pulse is 58 (abnormal). Her oxygen saturation is 100%. . Weight essentially stable.  The lungs are clear. The heart has a regular rhythm and rate. Left breast area shows diffuse hyperpigmentation changes and erythema. No moist desquamation. Impression . . . . . . . The patient is tolerating radiation. Plan . . . . . . . . . . . . Routine follow-up in one month  ________________________________   Blair Promise, PhD, MD

## 2016-08-12 ENCOUNTER — Ambulatory Visit: Payer: 59

## 2016-08-13 ENCOUNTER — Ambulatory Visit: Payer: 59

## 2016-08-13 ENCOUNTER — Encounter: Payer: Self-pay | Admitting: Radiation Oncology

## 2016-08-13 NOTE — Progress Notes (Signed)
  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 Patient had areas of diffuse hyperpigmentation changes and erythema to the left breast. She denied pain. She continued to be active during treatment.  Plan: The patient has completed radiation treatment. The patient will return to radiation oncology clinic for routine followup in one month. I advised them to call or return sooner if they have any questions or concerns related to their recovery or treatment.  -----------------------------------  Blair Promise, PhD, MD  This document serves as a record of services personally performed by Gery Pray, MD. It was created on his behalf by Bethann Humble, a trained medical scribe. The creation of this record is based on the scribe's personal observations and the provider's statements to them. This document has been checked and approved by the attending provider.

## 2016-08-14 ENCOUNTER — Ambulatory Visit: Payer: 59

## 2016-08-17 ENCOUNTER — Ambulatory Visit: Payer: 59

## 2016-08-18 ENCOUNTER — Ambulatory Visit: Payer: 59

## 2016-08-19 ENCOUNTER — Ambulatory Visit: Payer: 59

## 2016-08-20 ENCOUNTER — Ambulatory Visit: Payer: 59

## 2016-08-21 ENCOUNTER — Ambulatory Visit: Payer: 59

## 2016-08-24 ENCOUNTER — Ambulatory Visit: Payer: 59

## 2016-08-25 ENCOUNTER — Ambulatory Visit: Payer: 59

## 2016-08-26 ENCOUNTER — Ambulatory Visit: Payer: 59

## 2016-08-27 ENCOUNTER — Ambulatory Visit: Payer: 59

## 2016-09-03 ENCOUNTER — Ambulatory Visit (HOSPITAL_BASED_OUTPATIENT_CLINIC_OR_DEPARTMENT_OTHER): Payer: 59 | Admitting: Hematology and Oncology

## 2016-09-03 ENCOUNTER — Encounter: Payer: Self-pay | Admitting: Hematology and Oncology

## 2016-09-03 DIAGNOSIS — C50212 Malignant neoplasm of upper-inner quadrant of left female breast: Secondary | ICD-10-CM

## 2016-09-03 DIAGNOSIS — Z79811 Long term (current) use of aromatase inhibitors: Secondary | ICD-10-CM | POA: Diagnosis not present

## 2016-09-03 DIAGNOSIS — Z170 Estrogen receptor positive status [ER+]: Secondary | ICD-10-CM

## 2016-09-03 MED ORDER — ANASTROZOLE 1 MG PO TABS: 1.0000 mg | ORAL_TABLET | Freq: Every day | ORAL | 3 refills | Status: DC

## 2016-09-03 NOTE — Assessment & Plan Note (Signed)
Left lumpectomy 06/04/2016: IDC grade 1, 1.1 cm, margins negative, 0/6 lymph nodes negative, ER 100%, PR 95%, HER-2 negative ratio 1.68, Ki-67 5%, T1 CN 0 stage IA Oncotype DX score 9: Risk of recurrence 7% Adjuvant radiation therapy 07/13/2016 to 08/11/2016  Treatment plan: Adjuvant antiestrogen therapy with anastrozole 1 mg by mouth daily  Anastrozole counseling: We discussed the risks and benefits of anti-estrogen therapy with aromatase inhibitors. These include but not limited to insomnia, hot flashes, mood changes, vaginal dryness, bone density loss, and weight gain. We strongly believe that the benefits far outweigh the risks. Patient understands these risks and consented to starting treatment. Planned treatment duration is 5 years.

## 2016-09-03 NOTE — Progress Notes (Signed)
Patient Care Team: Lujean Amel, MD as PCP - General (Family Medicine) Alphonsa Overall, MD as Consulting Physician (General Surgery) Nicholas Lose, MD as Consulting Physician (Hematology and Oncology) Eppie Gibson, MD as Attending Physician (Radiation Oncology)  DIAGNOSIS:  Encounter Diagnosis  Name Primary?  . Malignant neoplasm of upper-inner quadrant of left breast in female, estrogen receptor positive ([place])     SUMMARY OF ONCOLOGIC HISTORY:   Malignant neoplasm of upper-inner quadrant of left breast in female, estrogen receptor positive ([place])   04/24/2016 Initial Diagnosis    Left breast biopsy 12:00: Benign, left breast biopsy 11:00: IDC low-grade, ER 100%, PR 95%, HER-2 negative ratio 1.68, Ki-67 5%; screening detected left breast distortion and calcifications UIQ 1.1 cm at 11:00 position axilla negative, T1 cN0 stage IA clinical stage      06/04/2016 Surgery    Left lumpectomy: IDC grade 1, 1.1 cm, margins negative, 0/6 lymph nodes negative, ER 100%, PR 95%, HER-2 negative ratio 1.68, Ki-67 5%, T1 CN 0 stage IA      06/17/2016 Oncotype testing    Oncotype DX score 9: Risk of recurrence 7%      07/13/2016 - 08/11/2016 Radiation Therapy    Adjuvant radiation therapy      09/03/2016 -  Anti-estrogen oral therapy    Anastrozole 1 mg daily       CHIEF COMPLIANT: Follow-up after radiation therapy to start antiestrogen therapy  INTERVAL HISTORY: Candice Caldwell is a 61 year old with above-mentioned history of left breast cancer underwent lumpectomy followed by adjuvant radiation. She is here today to discuss starting adjuvant antiestrogen therapy. She tolerated radiation therapy extremely well.  REVIEW OF SYSTEMS:   Constitutional: Denies fevers, chills or abnormal weight loss Eyes: Denies blurriness of vision Ears, nose, mouth, throat, and face: Denies mucositis or sore throat Respiratory: Denies cough, dyspnea or wheezes Cardiovascular: Denies palpitation, chest  discomfort Gastrointestinal:  Denies nausea, heartburn or change in bowel habits Skin: Denies abnormal skin rashes Lymphatics: Denies new lymphadenopathy or easy bruising Neurological:Denies numbness, tingling or new weaknesses Behavioral/Psych: Mood is stable, no new changes  Extremities: No lower extremity edema Breast:  Mild radiation dermatitis left breast All other systems were reviewed with the patient and are negative.  I have reviewed the past medical history, past surgical history, social history and family history with the patient and they are unchanged from previous note.  ALLERGIES:  has No Known Allergies.  MEDICATIONS:  Current Outpatient Prescriptions  Medication Sig Dispense Refill  . anastrozole (ARIMIDEX) 1 MG tablet Take 1 tablet (1 mg total) by mouth daily. 90 tablet 3  . aspirin 81 MG tablet Take 81 mg by mouth daily.    . Black Cohosh 40 MG CAPS Take by mouth.    . Multiple Vitamins-Minerals (OCUVITE EXTRA) TABS Take 1 tablet by mouth daily.    . Omega-3 Fatty Acids (FISH OIL CONCENTRATE PO) Take 1 tablet by mouth daily.     No current facility-administered medications for this visit.     PHYSICAL EXAMINATION: ECOG PERFORMANCE STATUS: 1 - Symptomatic but completely ambulatory  Vitals:   09/03/16 1524  BP: 127/80  Pulse: 86  Resp: 17  Temp: 98.8 F (37.1 C)   Filed Weights   09/03/16 1524  Weight: 136 lb (61.7 kg)    GENERAL:alert, no distress and comfortable SKIN: skin color, texture, turgor are normal, no rashes or significant lesions EYES: normal, Conjunctiva are pink and non-injected, sclera clear OROPHARYNX:no exudate, no erythema and lips, buccal mucosa, and tongue  normal  NECK: supple, thyroid normal size, non-tender, without nodularity LYMPH:  no palpable lymphadenopathy in the cervical, axillary or inguinal LUNGS: clear to auscultation and percussion with normal breathing effort HEART: regular rate & rhythm and no murmurs and no lower  extremity edema ABDOMEN:abdomen soft, non-tender and normal bowel sounds MUSCULOSKELETAL:no cyanosis of digits and no clubbing  NEURO: alert & oriented x 3 with fluent speech, no focal motor/sensory deficits EXTREMITIES: No lower extremity edema  LABORATORY DATA:  I have reviewed the data as listed   Chemistry      Component Value Date/Time   NA 138 05/06/2016 0822   K 3.9 05/06/2016 0822   CO2 24 05/06/2016 0822   BUN 14.5 05/06/2016 0822   CREATININE 0.8 05/06/2016 0822      Component Value Date/Time   CALCIUM 9.8 05/06/2016 0822   ALKPHOS 79 05/06/2016 0822   AST 24 05/06/2016 0822   ALT 26 05/06/2016 0822   BILITOT 0.47 05/06/2016 0822       Lab Results  Component Value Date   WBC 6.9 05/06/2016   HGB 13.7 05/06/2016   HCT 40.4 05/06/2016   MCV 95.1 05/06/2016   PLT 218 05/06/2016   NEUTROABS 4.6 05/06/2016    ASSESSMENT & PLAN:  Malignant neoplasm of upper-inner quadrant of left breast in female, estrogen receptor positive ([place]) Left lumpectomy 06/04/2016: IDC grade 1, 1.1 cm, margins negative, 0/6 lymph nodes negative, ER 100%, PR 95%, HER-2 negative ratio 1.68, Ki-67 5%, T1 CN 0 stage IA Oncotype DX score 9: Risk of recurrence 7% Adjuvant radiation therapy 07/13/2016 to 08/11/2016  Treatment plan: Adjuvant antiestrogen therapy with anastrozole 1 mg by mouth daily  Anastrozole counseling: We discussed the risks and benefits of anti-estrogen therapy with aromatase inhibitors. These include but not limited to insomnia, hot flashes, mood changes, vaginal dryness, bone density loss, and weight gain. We strongly believe that the benefits far outweigh the risks. Patient understands these risks and consented to starting treatment. Planned treatment duration is 5 years.   Return to clinic in 3 months for toxicity check and follow-up with Mendel Ryder for survivorship care plan. I spent 25 minutes talking to the patient of which more than half was spent in counseling and  coordination of care.  No orders of the defined types were placed in this encounter.  The patient has a good understanding of the overall plan. she agrees with it. she will call with any problems that may develop before the next visit here.   Rulon Eisenmenger, MD 09/03/16

## 2016-09-14 ENCOUNTER — Ambulatory Visit
Admission: RE | Admit: 2016-09-14 | Discharge: 2016-09-14 | Disposition: A | Discharge status: Home / Self Care | Payer: 59 | Source: Ambulatory Visit | Attending: Radiation Oncology | Admitting: Radiation Oncology

## 2016-09-14 ENCOUNTER — Encounter: Payer: Self-pay | Admitting: Oncology

## 2016-09-14 DIAGNOSIS — Z170 Estrogen receptor positive status [ER+]: Principal | ICD-10-CM

## 2016-09-14 DIAGNOSIS — C50212 Malignant neoplasm of upper-inner quadrant of left female breast: Secondary | ICD-10-CM

## 2016-09-14 DIAGNOSIS — Z510 Encounter for antineoplastic radiation therapy: Secondary | ICD-10-CM | POA: Diagnosis not present

## 2016-09-14 HISTORY — DX: Personal history of irradiation: Z92.3

## 2016-09-14 NOTE — Progress Notes (Signed)
Candice Caldwell is here for follow up.  She denies having any pain or fatigue.  She is taking Arimidex.  The skin on her left breast has hyperpigmentation.  BP 120/67 (BP Location: Right Arm, Patient Position: Sitting)   Pulse 63   Temp 98.7 F (37.1 C) (Oral)   Ht 5\' 8"  (1.727 m)   Wt 135 lb (61.2 kg)   SpO2 98%   BMI 20.53 kg/m    Wt Readings from Last 3 Encounters:  09/14/16 135 lb (61.2 kg)  09/03/16 136 lb (61.7 kg)  08/11/16 136 lb 3.2 oz (61.8 kg)

## 2016-09-14 NOTE — Progress Notes (Signed)
  Radiation Oncology         [phone] ________________________________  Name: Candice Caldwell MRN: [id number]  Date: 09/14/2016  DOB: Feb 06, 1956  Follow-Up Visit Note  CC: Koirala, Dibas, MD  Alphonsa Overall, MD    ICD-10-CM   1. Malignant neoplasm of upper-inner quadrant of left breast in female, estrogen receptor positive ([place]) C50.212    Z17.0     Diagnosis: 61 y.o. 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   Interval Since Last Radiation:  1 months  Narrative:  The patient returns today for routine follow-up. She has started on Arimidex, no side effects hs been noted. Continues to exercise and is being active. Denies having fatigue, soreness, nipple discharge, and bleeding. No swelling of the arm. Is slowly getting back into the work flow.      ALLERGIES:  has No Known Allergies.  Meds: Current Outpatient Prescriptions  Medication Sig Dispense Refill  . anastrozole (ARIMIDEX) 1 MG tablet Take 1 tablet (1 mg total) by mouth daily. 90 tablet 3  . aspirin 81 MG tablet Take 81 mg by mouth daily.    . Black Cohosh 40 MG CAPS Take by mouth.    . MULTIPLE VITAMIN PO Take by mouth.    . Multiple Vitamins-Minerals (OCUVITE EXTRA) TABS Take 1 tablet by mouth daily.    . Omega-3 Fatty Acids (FISH OIL CONCENTRATE PO) Take 1 tablet by mouth daily.     No current facility-administered medications for this encounter.     Physical Findings: The patient is in no acute distress. Patient is alert and oriented.  height is 5' 8" (1.727 m) and weight is 135 lb (61.2 kg). Her oral temperature is 98.7 F (37.1 C). Her blood pressure is 120/67 and her pulse is 63. Her oxygen saturation is 98%.    Patient's right breast has no palpable mass or nipple discharge. The left breast has hyperpigmentation changes. left breast has no skin break down,  nipple discharge or bleeding.  Lungs are clear to auscultation bilaterally. Heart has  regular rate and rhythm. No palpable cervical, supraclavicular, or axillary adenopathy. Abdomen soft, non-tender, normal bowel sounds.   Lab Findings: Lab Results  Component Value Date   WBC 6.9 05/06/2016   HGB 13.7 05/06/2016   HCT 40.4 05/06/2016   MCV 95.1 05/06/2016   PLT 218 05/06/2016    Radiographic Findings: No results found.  Impression:  The patient is recovering from the effects of radiation. No evidence of occurrence on clincal exam.   Plan:  Prn follow up with Radiation Oncology. Continue to follow up with Medical Oncology.  ____________________________________ -----------------------------------  Blair Promise, PhD, MD  This document serves as a record of services personally performed by Gery Pray MD. It was created on his behalf by Delton Coombes, a trained medical scribe. The creation of this record is based on the scribe's personal observations and the provider's statements to them. This document has been checked and approved by the attending provider.

## 2016-11-20 ENCOUNTER — Encounter: Payer: 59 | Admitting: Adult Health

## 2016-11-24 ENCOUNTER — Telehealth: Payer: Self-pay | Admitting: Adult Health

## 2016-11-24 ENCOUNTER — Ambulatory Visit (HOSPITAL_BASED_OUTPATIENT_CLINIC_OR_DEPARTMENT_OTHER): Payer: 59 | Admitting: Adult Health

## 2016-11-24 ENCOUNTER — Encounter: Payer: Self-pay | Admitting: Adult Health

## 2016-11-24 VITALS — BP 128/62 | HR 54 | Temp 98.5°F | Resp 18 | Ht 68.0 in | Wt 136.0 lb

## 2016-11-24 DIAGNOSIS — Z79811 Long term (current) use of aromatase inhibitors: Secondary | ICD-10-CM

## 2016-11-24 DIAGNOSIS — E2839 Other primary ovarian failure: Secondary | ICD-10-CM

## 2016-11-24 DIAGNOSIS — Z170 Estrogen receptor positive status [ER+]: Secondary | ICD-10-CM | POA: Diagnosis not present

## 2016-11-24 DIAGNOSIS — C50212 Malignant neoplasm of upper-inner quadrant of left female breast: Secondary | ICD-10-CM

## 2016-11-24 NOTE — Progress Notes (Signed)
CLINIC:  Survivorship   REASON FOR VISIT:  Routine follow-up post-treatment for a recent history of breast cancer.  BRIEF ONCOLOGIC HISTORY:    Malignant neoplasm of upper-inner quadrant of left breast in female, estrogen receptor positive ([place])   04/24/2016 Initial Diagnosis    Left breast biopsy 12:00: Benign, left breast biopsy 11:00: IDC low-grade, ER 100%, PR 95%, HER-2 negative ratio 1.68, Ki-67 5%; screening detected left breast distortion and calcifications UIQ 1.1 cm at 11:00 position axilla negative, T1 cN0 stage IA clinical stage      06/04/2016 Surgery    Left lumpectomy: IDC grade 1, 1.1 cm, margins negative, 0/6 lymph nodes negative, ER 100%, PR 95%, HER-2 negative ratio 1.68, Ki-67 5%, T1 CN 0 stage IA      06/17/2016 Oncotype testing    Oncotype DX score 9: Risk of recurrence 7%      07/13/2016 - 08/11/2016 Radiation Therapy    Adjuvant radiation therapy      09/03/2016 -  Anti-estrogen oral therapy    Anastrozole 1 mg daily       INTERVAL HISTORY:  Candice Caldwell presents to the [facility] today for our initial meeting to review her survivorship care plan detailing her treatment course for breast cancer, as well as monitoring long-term side effects of that treatment, education regarding health maintenance, screening, and overall wellness and health promotion.     Overall, Candice Caldwell reports feeling quite well since completing her radiation therapy.  She was started on Anastrozole and is toelrating it well.  She does have some stiffness in her finger joints in the morning and that resolves with movement.  Otherwise she has no issues.      REVIEW OF SYSTEMS:  Review of Systems  Constitutional: Negative for appetite change, chills, diaphoresis, fatigue, fever and unexpected weight change.  HENT:   Negative for hearing loss and lump/mass.   Eyes: Negative for eye problems and icterus.  Respiratory: Negative for chest tightness, cough and shortness of breath.     Cardiovascular: Negative for chest pain and leg swelling.  Gastrointestinal: Negative for abdominal distention, abdominal pain, diarrhea, nausea and vomiting.  Endocrine: Negative for hot flashes.  Genitourinary: Negative for difficulty urinating and dyspareunia.   Musculoskeletal: Negative for arthralgias.  Skin: Negative for itching and rash.  Neurological: Negative for dizziness, extremity weakness and headaches.  Psychiatric/Behavioral: Negative for depression. The patient is not nervous/anxious.   Breast: Denies any new nodularity, masses, tenderness, nipple changes, or nipple discharge.      ONCOLOGY TREATMENT TEAM:  1. Surgeon:  Dr. Lucia Gaskins at [facility] 2. Medical Oncologist: Dr. Nicholas Lose  3. Radiation Oncologist: Dr. Peyton Bottoms    PAST MEDICAL/SURGICAL HISTORY:  Past Medical History:  Diagnosis Date  . Cancer ([place]) 04/2016   left breast cancer  . History of radiation therapy 07/13/16-08/11/16   left breast 42.5 Gy in 16 fractions, left breast boost 10 Gy in 5 fractions   Past Surgical History:  Procedure Laterality Date  . BREAST LUMPECTOMY WITH RADIOACTIVE SEED AND SENTINEL LYMPH NODE BIOPSY Left 06/04/2016   Procedure: LEFT BREAST LUMPECTOMY WITH RADIOACTIVE SEED AND SENTINEL LYMPH NODE BIOPSY;  Surgeon: Alphonsa Overall, MD;  Location: [place];  Service: General;  Laterality: Left;  . EYE SURGERY     lasik  . SPINAL FUSION       ALLERGIES:  No Known Allergies   CURRENT MEDICATIONS:  Outpatient Encounter Prescriptions as of 11/24/2016  Medication Sig  .  anastrozole (ARIMIDEX) 1 MG tablet Take 1 tablet (1 mg total) by mouth daily.  Marland Kitchen aspirin 81 MG tablet Take 81 mg by mouth daily.  . Black Cohosh 40 MG CAPS Take by mouth.  . MULTIPLE VITAMIN PO Take by mouth.  . Multiple Vitamins-Minerals (OCUVITE EXTRA) TABS Take 1 tablet by mouth daily.  . Omega-3 Fatty Acids (FISH OIL CONCENTRATE PO) Take 1 tablet by mouth daily.   No  facility-administered encounter medications on file as of 11/24/2016.      ONCOLOGIC FAMILY HISTORY:  Non Contributory     SOCIAL HISTORY:  Ja Caldwell is single and lives in [place], [place].  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  She denies any current or history of tobacco, alcohol, or illicit drug use.     PHYSICAL EXAMINATION:  Vital Signs:   Vitals:   11/24/16 1413  BP: 128/62  Pulse: (!) 54  Resp: 18  Temp: 98.5 F (36.9 C)  SpO2: 100%   Filed Weights   11/24/16 1413  Weight: 136 lb (61.7 kg)   General: Well-nourished, well-appearing female in no acute distress.  She is unaccompanied today.   HEENT: Head is normocephalic.  Pupils equal and reactive to light. Conjunctivae clear without exudate.  Sclerae anicteric. Oral mucosa is pink, moist.  Oropharynx is pink without lesions or erythema.  Lymph: No cervical, supraclavicular, or infraclavicular lymphadenopathy noted on palpation.  Cardiovascular: Regular rate and rhythm.Marland Kitchen Respiratory: Clear to auscultation bilaterally. Chest expansion symmetric; breathing non-labored.  GI: Abdomen soft and round; non-tender, non-distended. Bowel sounds normoactive.  GU: Deferred.  Neuro: No focal deficits. Steady gait.  Psych: Mood and affect normal and appropriate for situation.  Extremities: No edema. MSK: No focal spinal tenderness to palpation.  Full range of motion in bilateral upper extremities Skin: Warm and dry.  LABORATORY DATA:  None for this visit.  DIAGNOSTIC IMAGING:  None for this visit.      ASSESSMENT AND PLAN:  Candice Caldwell is a pleasant 61 y.o. female with Stage IA left breast invasive ductal carcinoma, ER+/PR+/HER2-, diagnosed in 04/2016, treated with lumpectomy, adjuvant radiation therapy, and anti-estrogen therapy with Anastrozole beginning in 08/2016.  She presents to the Survivorship Clinic for our initial meeting and routine  follow-up post-completion of treatment for breast cancer.    1. Stage IA left breast cancer:  [name] is continuing to recover from definitive treatment for breast cancer. She will follow-up with her medical oncologist, Dr. Lindi Adie in 05/2017 with history and physical exam per surveillance protocol.  She will continue her anti-estrogen therapy with Anastrozole. Thus far, she is tolerating the Anastrozole well, with minimal side effects. She was instructed to make Dr. Lindi Adie or myself aware if she begins to experience any worsening side effects of the medication and I could see her back in clinic to help manage those side effects, as needed. Today, a comprehensive survivorship care plan and treatment summary was reviewed with the patient today detailing her breast cancer diagnosis, treatment course, potential late/long-term effects of treatment, appropriate follow-up care with recommendations for the future, and patient education resources.  A copy of this summary, along with a letter will be sent to the patient's primary care provider via mail/fax/In Basket message after today's visit.    2. Bone health:  Given Ms. Dault age/history of breast cancer and her current treatment regimen including anti-estrogen therapy with Anastrozole, she is at risk for bone  demineralization.  She has not had a bone density test, and I ordered this for her today.  She takes calcium, vitamin d, and is very physically active.  She was given education on specific activities to promote bone health.  3. Cancer screening:  Due to Ms. Croom's history and her age, she should receive screening for skin cancers, colon cancer, and gynecologic cancers.  The information and recommendations are listed on the patient's comprehensive care plan/treatment summary and were reviewed in detail with the patient.    4. Health maintenance and wellness promotion: Ms. Parslow was encouraged to consume 5-7 servings of fruits and vegetables per day. We  reviewed the "Nutrition Rainbow" handout, as well as the handout "Take Control of Your Health and Reduce Your Cancer Risk" from the [facility].  She was also encouraged to engage in moderate to vigorous exercise for 30 minutes per day most days of the week. We discussed the [employer] fitness program, which is designed for cancer survivors to help them become more physically fit after cancer treatments.  She was instructed to limit her alcohol consumption and continue to abstain from tobacco use.     5. Support services/counseling: It is not uncommon for this period of the patient's cancer care trajectory to be one of many emotions and stressors.  We discussed an opportunity for her to participate in the next session of [facility] ("Finding Your New Normal") support group series designed for patients after they have completed treatment.   Ms. Tomasini was encouraged to take advantage of our many other support services programs, support groups, and/or counseling in coping with her new life as a cancer survivor after completing anti-cancer treatment.  She was offered support today through active listening and expressive supportive counseling.  She was given information regarding our available services and encouraged to contact me with any questions or for help enrolling in any of our support group/programs.    Dispo:   -Return to cancer center for follow up with Dr. Lindi Adie 05/2017 -Mammogram due in 04/2017 -Follow up with Dr. Lucia Gaskins at [facility] in 12/2016 -She is welcome to return back to the Survivorship Clinic at any time; no additional follow-up needed at this time.  -Consider referral back to survivorship as a long-term survivor for continued surveillance  A total of (30) minutes of face-to-face time was spent with this patient with greater than 50% of that time in counseling and care-coordination.   Gardenia Phlegm, NP Survivorship Program [facility] [phone]   Note: PRIMARY CARE PROVIDER [place], [place] [phone]

## 2016-11-24 NOTE — Telephone Encounter (Signed)
Gave patient avs and calendar with appts per 9/18 los

## 2017-03-10 ENCOUNTER — Other Ambulatory Visit: Payer: Self-pay | Admitting: Family Medicine

## 2017-03-10 ENCOUNTER — Other Ambulatory Visit (HOSPITAL_COMMUNITY)
Admission: RE | Admit: 2017-03-10 | Discharge: 2017-03-10 | Disposition: A | Discharge status: Home / Self Care | Payer: BLUE CROSS/BLUE SHIELD | Source: Ambulatory Visit | Attending: Family Medicine | Admitting: Family Medicine

## 2017-03-10 DIAGNOSIS — Z124 Encounter for screening for malignant neoplasm of cervix: Secondary | ICD-10-CM | POA: Insufficient documentation

## 2017-03-11 LAB — CYTOLOGY - PAP
DIAGNOSIS: NEGATIVE
HPV (WINDOPATH): NOT DETECTED

## 2017-04-26 ENCOUNTER — Ambulatory Visit
Admission: RE | Admit: 2017-04-26 | Discharge: 2017-04-26 | Disposition: A | Discharge status: Home / Self Care | Payer: BLUE CROSS/BLUE SHIELD | Source: Ambulatory Visit | Attending: Adult Health | Admitting: Adult Health

## 2017-04-26 ENCOUNTER — Ambulatory Visit
Admission: RE | Admit: 2017-04-26 | Discharge: 2017-04-26 | Disposition: A | Discharge status: Home / Self Care | Payer: 59 | Source: Ambulatory Visit | Attending: Adult Health | Admitting: Adult Health

## 2017-04-26 DIAGNOSIS — E2839 Other primary ovarian failure: Secondary | ICD-10-CM

## 2017-04-26 DIAGNOSIS — Z170 Estrogen receptor positive status [ER+]: Principal | ICD-10-CM

## 2017-04-26 DIAGNOSIS — C50212 Malignant neoplasm of upper-inner quadrant of left female breast: Secondary | ICD-10-CM

## 2017-04-26 HISTORY — DX: Malignant neoplasm of unspecified site of unspecified female breast: C50.919

## 2017-04-27 ENCOUNTER — Telehealth: Payer: Self-pay | Admitting: Adult Health

## 2017-04-27 NOTE — Telephone Encounter (Signed)
Patient called and requested that I explain her bone density and mammo reports to her.  Her mammogram was normal.  She had no questions about her mammogram.  Her bone density demonstrated a T score of -2.1 in the forearm.  This is consistent with osteopenia.  I recommended that she continue with calcium, vitamin d, and weight bearing exercises.  She is going to do this.  I suggested she consider starting bisphosphanate therapy.  She declines bisphosphanate therapy at this point.  She knows that repeat bone density is due in 2 years, and at that point will consider starting bisphosphanate if there is further decline in her bone density.    Wilber Bihari, NP

## 2017-04-28 ENCOUNTER — Other Ambulatory Visit: Payer: Self-pay | Admitting: Adult Health

## 2017-04-28 DIAGNOSIS — M8580 Other specified disorders of bone density and structure, unspecified site: Secondary | ICD-10-CM

## 2017-04-28 MED ORDER — ALENDRONATE SODIUM 70 MG PO TABS: 70.0000 mg | ORAL_TABLET | ORAL | 2 refills | Status: DC

## 2017-04-28 NOTE — Progress Notes (Signed)
Spoke with patient again today about her bone density and osteopenia while on the Anastrozole.  She would like to start a bisphosphanate.  I reviewed the risks and benefits with her in detail.  She sees the dentist regularly.  She has no dental issues.  I will send in fosamax.  She knows to call for any concerns.    Wilber Bihari, NP

## 2017-05-24 ENCOUNTER — Inpatient Hospital Stay: Payer: BLUE CROSS/BLUE SHIELD | Attending: Hematology and Oncology | Admitting: Hematology and Oncology

## 2017-05-24 ENCOUNTER — Telehealth: Payer: Self-pay | Admitting: Hematology and Oncology

## 2017-05-24 DIAGNOSIS — C50212 Malignant neoplasm of upper-inner quadrant of left female breast: Secondary | ICD-10-CM | POA: Diagnosis not present

## 2017-05-24 DIAGNOSIS — Z170 Estrogen receptor positive status [ER+]: Secondary | ICD-10-CM | POA: Diagnosis not present

## 2017-05-24 DIAGNOSIS — Z79811 Long term (current) use of aromatase inhibitors: Secondary | ICD-10-CM

## 2017-05-24 DIAGNOSIS — Z923 Personal history of irradiation: Secondary | ICD-10-CM

## 2017-05-24 MED ORDER — ANASTROZOLE 1 MG PO TABS: 1.0000 mg | ORAL_TABLET | Freq: Every day | ORAL | 3 refills | Status: DC

## 2017-05-24 NOTE — Telephone Encounter (Signed)
Gave avs and calendar ° °

## 2017-05-24 NOTE — Progress Notes (Signed)
Patient Care Team: Lujean Amel, MD as PCP - General (Family Medicine) Alphonsa Overall, MD as Consulting Physician (General Surgery) Nicholas Lose, MD as Consulting Physician (Hematology and Oncology) Eppie Gibson, MD as Attending Physician (Radiation Oncology) Gardenia Phlegm, NP as Nurse Practitioner (Hematology and Oncology)  DIAGNOSIS:  Encounter Diagnosis  Name Primary?  . Malignant neoplasm of upper-inner quadrant of left breast in female, estrogen receptor positive ([place])     SUMMARY OF ONCOLOGIC HISTORY:   Malignant neoplasm of upper-inner quadrant of left breast in female, estrogen receptor positive ([place])   04/24/2016 Initial Diagnosis    Left breast biopsy 12:00: Benign, left breast biopsy 11:00: IDC low-grade, ER 100%, PR 95%, HER-2 negative ratio 1.68, Ki-67 5%; screening detected left breast distortion and calcifications UIQ 1.1 cm at 11:00 position axilla negative, T1 cN0 stage IA clinical stage      06/04/2016 Surgery    Left lumpectomy: IDC grade 1, 1.1 cm, margins negative, 0/6 lymph nodes negative, ER 100%, PR 95%, HER-2 negative ratio 1.68, Ki-67 5%, T1 CN 0 stage IA      06/17/2016 Oncotype testing    Oncotype DX score 9: Risk of recurrence 7%      07/13/2016 - 08/11/2016 Radiation Therapy    Adjuvant radiation therapy      09/03/2016 -  Anti-estrogen oral therapy    Anastrozole 1 mg daily       CHIEF COMPLIANT: Follow-up on anastrozole therapy  INTERVAL HISTORY: Candice Caldwell is a 62 year old with above-mentioned history of a left breast cancer treated with lumpectomy followed by adjuvant radiation and chemotherapy.  She appears to be tolerating very well.  She denies any hot flashes or myalgias.  Denies any lumps or nodules in the breast.  REVIEW OF SYSTEMS:   Constitutional: Denies fevers, chills or abnormal weight loss Eyes: Denies blurriness of vision Ears, nose, mouth, throat, and face: Denies mucositis or sore throat Respiratory: Denies  cough, dyspnea or wheezes Cardiovascular: Denies palpitation, chest discomfort Gastrointestinal:  Denies nausea, heartburn or change in bowel habits Skin: Denies abnormal skin rashes Lymphatics: Denies new lymphadenopathy or easy bruising Neurological:Denies numbness, tingling or new weaknesses Behavioral/Psych: Mood is stable, no new changes  Extremities: No lower extremity edema Breast:  denies any pain or lumps or nodules in either breasts All other systems were reviewed with the patient and are negative.  I have reviewed the past medical history, past surgical history, social history and family history with the patient and they are unchanged from previous note.  ALLERGIES:  has No Known Allergies.  MEDICATIONS:  Current Outpatient Medications  Medication Sig Dispense Refill  . alendronate (FOSAMAX) 70 MG tablet Take 1 tablet (70 mg total) by mouth once a week. Take with a full glass of water on an empty stomach. 4 tablet 2  . anastrozole (ARIMIDEX) 1 MG tablet Take 1 tablet (1 mg total) by mouth daily. 90 tablet 3  . aspirin 81 MG tablet Take 81 mg by mouth daily.    . MULTIPLE VITAMIN PO Take by mouth.    . Multiple Vitamins-Minerals (OCUVITE EXTRA) TABS Take 1 tablet by mouth daily.    . Omega-3 Fatty Acids (FISH OIL CONCENTRATE PO) Take 1 tablet by mouth daily.     No current facility-administered medications for this visit.     PHYSICAL EXAMINATION: ECOG PERFORMANCE STATUS: 1 - Symptomatic but completely ambulatory  Vitals:   05/24/17 1114  BP: 140/82  Pulse: (!) 53  Resp: 16  Temp: 98.3 F (  36.8 C)  SpO2: 100%   Filed Weights   05/24/17 1114  Weight: 137 lb 1.6 oz (62.2 kg)    GENERAL:alert, no distress and comfortable SKIN: skin color, texture, turgor are normal, no rashes or significant lesions EYES: normal, Conjunctiva are pink and non-injected, sclera clear OROPHARYNX:no exudate, no erythema and lips, buccal mucosa, and tongue normal  NECK: supple,  thyroid normal size, non-tender, without nodularity LYMPH:  no palpable lymphadenopathy in the cervical, axillary or inguinal LUNGS: clear to auscultation and percussion with normal breathing effort HEART: regular rate & rhythm and no murmurs and no lower extremity edema ABDOMEN:abdomen soft, non-tender and normal bowel sounds MUSCULOSKELETAL:no cyanosis of digits and no clubbing  NEURO: alert & oriented x 3 with fluent speech, no focal motor/sensory deficits EXTREMITIES: No lower extremity edema BREAST: No palpable masses or nodules in either right or left breasts. No palpable axillary supraclavicular or infraclavicular adenopathy no breast tenderness or nipple discharge. (exam performed in the presence of a chaperone)  LABORATORY DATA:  I have reviewed the data as listed CMP Latest Ref Rng & Units 05/06/2016  Glucose 70 - 140 mg/dl 114  BUN 7.0 - 26.0 mg/dL 14.5  Creatinine 0.6 - 1.1 mg/dL 0.8  Sodium 136 - 145 mEq/L 138  Potassium 3.5 - 5.1 mEq/L 3.9  CO2 22 - 29 mEq/L 24  Calcium 8.4 - 10.4 mg/dL 9.8  Total Protein 6.4 - 8.3 g/dL 7.6  Total Bilirubin 0.20 - 1.20 mg/dL 0.47  Alkaline Phos 40 - 150 U/L 79  AST 5 - 34 U/L 24  ALT 0 - 55 U/L 26    Lab Results  Component Value Date   WBC 6.9 05/06/2016   HGB 13.7 05/06/2016   HCT 40.4 05/06/2016   MCV 95.1 05/06/2016   PLT 218 05/06/2016   NEUTROABS 4.6 05/06/2016    ASSESSMENT & PLAN:  Malignant neoplasm of upper-inner quadrant of left breast in female, estrogen receptor positive ([place]) Left lumpectomy 06/04/2016: IDC grade 1, 1.1 cm, margins negative, 0/6 lymph nodes negative, ER 100%, PR 95%, HER-2 negative ratio 1.68, Ki-67 5%, T1 CN 0 stage IA Oncotype DX score 9: Risk of recurrence 7% Adjuvant radiation therapy 07/13/2016 to 08/11/2016  Treatment plan: Adjuvant antiestrogen therapy with anastrozole 1 mg by mouth daily Started 08/2016  Anastrozole toxicities: Denies any hot flashes or myalgias  Breast Cancer  Surveillance: 1. Breast exam 05/24/2017: Normal 2. Mammogram 04/26/2017 no abnormalities. Postsurgical changes. Breast Density Category C.   Return to clinic in 1 year for follow-up    I spent 25 minutes talking to the patient of which more than half was spent in counseling and coordination of care.  No orders of the defined types were placed in this encounter.  The patient has a good understanding of the overall plan. she agrees with it. she will call with any problems that may develop before the next visit here.   Harriette Ohara, MD 05/24/17

## 2017-05-24 NOTE — Assessment & Plan Note (Signed)
Left lumpectomy 06/04/2016: IDC grade 1, 1.1 cm, margins negative, 0/6 lymph nodes negative, ER 100%, PR 95%, HER-2 negative ratio 1.68, Ki-67 5%, T1 CN 0 stage IA Oncotype DX score 9: Risk of recurrence 7% Adjuvant radiation therapy 07/13/2016 to 08/11/2016  Treatment plan: Adjuvant antiestrogen therapy with anastrozole 1 mg by mouth daily Started 08/2016  Anastrozole toxicities   Breast Cancer Surveillance: 1. Breast exam 05/24/2017: Normal 2. Mammogram 04/26/2017 no abnormalities. Postsurgical changes. Breast Density Category C.   Return to clinic in 1 year for follow-up

## 2017-08-01 ENCOUNTER — Other Ambulatory Visit: Payer: Self-pay | Admitting: Adult Health

## 2017-08-01 DIAGNOSIS — M8580 Other specified disorders of bone density and structure, unspecified site: Secondary | ICD-10-CM

## 2017-08-05 ENCOUNTER — Other Ambulatory Visit: Payer: Self-pay | Admitting: Adult Health

## 2017-08-05 DIAGNOSIS — M8580 Other specified disorders of bone density and structure, unspecified site: Secondary | ICD-10-CM

## 2017-08-05 MED ORDER — ALENDRONATE SODIUM 70 MG PO TABS: 70.0000 mg | ORAL_TABLET | ORAL | 5 refills | Status: DC

## 2017-08-05 NOTE — Telephone Encounter (Signed)
Please see if patient is tolerating fosamax well.  If so, then ok to refill.  Thanks,  [place]

## 2017-11-22 ENCOUNTER — Other Ambulatory Visit: Payer: Self-pay | Admitting: Adult Health

## 2017-11-22 DIAGNOSIS — M8580 Other specified disorders of bone density and structure, unspecified site: Secondary | ICD-10-CM

## 2017-11-29 NOTE — Telephone Encounter (Signed)
Please call patient and ensure she is tolerating fosamax well and is seeing dentistry regularly/has no dental concerns.  Please document in phone note

## 2017-12-01 NOTE — Telephone Encounter (Signed)
Please call and ensure she is tolerating fosamax well and document in phone note.

## 2018-05-25 ENCOUNTER — Ambulatory Visit: Payer: BLUE CROSS/BLUE SHIELD | Admitting: Hematology and Oncology

## 2020-04-12 ENCOUNTER — Other Ambulatory Visit: Payer: Self-pay | Admitting: Critical Care Medicine

## 2020-04-12 DIAGNOSIS — Z9889 Other specified postprocedural states: Secondary | ICD-10-CM

## 2020-05-23 ENCOUNTER — Other Ambulatory Visit: Payer: Self-pay | Admitting: Critical Care Medicine

## 2020-05-23 ENCOUNTER — Other Ambulatory Visit: Payer: Self-pay

## 2020-05-23 ENCOUNTER — Ambulatory Visit
Admission: RE | Admit: 2020-05-23 | Discharge: 2020-05-23 | Disposition: A | Discharge status: Home / Self Care | Payer: BLUE CROSS/BLUE SHIELD | Source: Ambulatory Visit | Attending: Critical Care Medicine | Admitting: Critical Care Medicine

## 2020-05-23 DIAGNOSIS — Z9889 Other specified postprocedural states: Secondary | ICD-10-CM

## 2020-05-23 DIAGNOSIS — Z1231 Encounter for screening mammogram for malignant neoplasm of breast: Secondary | ICD-10-CM

## 2020-06-17 ENCOUNTER — Telehealth: Payer: Self-pay | Admitting: Hematology and Oncology

## 2020-06-17 NOTE — Telephone Encounter (Signed)
Scheduled per 4/4 staff msg. Called and spoke with pt, confirmed 4/27 appt

## 2020-07-02 NOTE — Assessment & Plan Note (Signed)
Left lumpectomy 06/04/2016: IDC grade 1, 1.1 cm, margins negative, 0/6 lymph nodes negative, ER 100%, PR 95%, HER-2 negative ratio 1.68, Ki-67 5%, T1 CN 0 stage IA Oncotype DX score 9: Risk of recurrence 7% Adjuvant radiation therapy 07/13/2016 to 08/11/2016  Treatment plan: Adjuvant antiestrogen therapy with anastrozole 1 mg by mouth daily Started 08/2016  Anastrozole toxicities: Denies any hot flashes or myalgias  Breast Cancer Surveillance: 1. Breast exam 07/02/20: Normal 2. Mammogram 05/26/20: no abnormalities. Postsurgical changes. Breast Density Category C.   Return to clinic in 1 year for follow-up

## 2020-07-02 NOTE — Progress Notes (Signed)
Patient Care Team: Lujean Amel, MD as PCP - General (Family Medicine) Alphonsa Overall, MD as Consulting Physician (General Surgery) Nicholas Lose, MD as Consulting Physician (Hematology and Oncology) Eppie Gibson, MD as Attending Physician (Radiation Oncology) Gardenia Phlegm, NP as Nurse Practitioner (Hematology and Oncology)  DIAGNOSIS:    ICD-10-CM   1. Malignant neoplasm of upper-inner quadrant of left breast in female, estrogen receptor positive ([place])  C50.212    Z17.0     SUMMARY OF ONCOLOGIC HISTORY: Oncology History  Malignant neoplasm of upper-inner quadrant of left breast in female, estrogen receptor positive ([place])  04/24/2016 Initial Diagnosis   Left breast biopsy 12:00: Benign, left breast biopsy 11:00: IDC low-grade, ER 100%, PR 95%, HER-2 negative ratio 1.68, Ki-67 5%; screening detected left breast distortion and calcifications UIQ 1.1 cm at 11:00 position axilla negative, T1 cN0 stage IA clinical stage   06/04/2016 Surgery   Left lumpectomy: IDC grade 1, 1.1 cm, margins negative, 0/6 lymph nodes negative, ER 100%, PR 95%, HER-2 negative ratio 1.68, Ki-67 5%, T1 CN 0 stage IA   06/17/2016 Oncotype testing   Oncotype DX score 9: Risk of recurrence 7%   07/13/2016 - 08/11/2016 Radiation Therapy   Adjuvant radiation therapy   09/03/2016 -  Anti-estrogen oral therapy   Anastrozole 1 mg daily     CHIEF COMPLIANT: Follow-up of left breast cancer on anastrozole therapy  INTERVAL HISTORY: Candice Caldwell is a 65 y.o. with above-mentioned history of left breast cancer treated with lumpectomy, adjuvant radiation, chemotherapy, and is currently on antiestrogen therapy with anastrozole. Mammogram on 05/23/20 showed no evidence of malignancy bilaterally. I last saw her 3 years ago. She presents to the clinic today for follow-up.  She had a fall while hiking and broke a couple of ribs.  She recently hurt the same ribs again.  Other than that she is doing great.  She does not  have any side effects to anastrozole therapy.  ALLERGIES:  has No Known Allergies.  MEDICATIONS:  Current Outpatient Medications  Medication Sig Dispense Refill  . alendronate (FOSAMAX) 70 MG tablet TAKE 1 TABLET BY MOUTH ONCE WEEKLY BEFORE BREAKFAST, ON AN EMPTY STOMACH: REMAIN UPRIGHT FOR 30 MINUTES:TAKE WITH 8 OUNCES OF WATER 12 tablet 4  . anastrozole (ARIMIDEX) 1 MG tablet Take 1 tablet (1 mg total) by mouth daily. 90 tablet 3  . aspirin 81 MG tablet Take 81 mg by mouth daily.    . MULTIPLE VITAMIN PO Take by mouth.    . Multiple Vitamins-Minerals (OCUVITE EXTRA) TABS Take 1 tablet by mouth daily.    . Omega-3 Fatty Acids (FISH OIL CONCENTRATE PO) Take 1 tablet by mouth daily.     No current facility-administered medications for this visit.    PHYSICAL EXAMINATION: ECOG PERFORMANCE STATUS: 1 - Symptomatic but completely ambulatory  Vitals:   07/03/20 1010  BP: 133/67  Pulse: 82  Resp: 18  Temp: 97.7 F (36.5 C)  SpO2: 100%   Filed Weights   07/03/20 1010  Weight: 128 lb 14.4 oz (58.5 kg)    BREAST: No palpable masses or nodules in either right or left breasts. No palpable axillary supraclavicular or infraclavicular adenopathy no breast tenderness or nipple discharge. (exam performed in the presence of a chaperone)  LABORATORY DATA:  I have reviewed the data as listed CMP Latest Ref Rng & Units 05/06/2016  Glucose 70 - 140 mg/dl 114  BUN 7.0 - 26.0 mg/dL 14.5  Creatinine 0.6 - 1.1 mg/dL 0.8  Sodium 136 - 145 mEq/L 138  Potassium 3.5 - 5.1 mEq/L 3.9  CO2 22 - 29 mEq/L 24  Calcium 8.4 - 10.4 mg/dL 9.8  Total Protein 6.4 - 8.3 g/dL 7.6  Total Bilirubin 0.20 - 1.20 mg/dL 0.47  Alkaline Phos 40 - 150 U/L 79  AST 5 - 34 U/L 24  ALT 0 - 55 U/L 26    Lab Results  Component Value Date   WBC 6.9 05/06/2016   HGB 13.7 05/06/2016   HCT 40.4 05/06/2016   MCV 95.1 05/06/2016   PLT 218 05/06/2016   NEUTROABS 4.6 05/06/2016    ASSESSMENT & PLAN:  Malignant neoplasm  of upper-inner quadrant of left breast in female, estrogen receptor positive ([place]) Left lumpectomy 06/04/2016: IDC grade 1, 1.1 cm, margins negative, 0/6 lymph nodes negative, ER 100%, PR 95%, HER-2 negative ratio 1.68, Ki-67 5%, T1 CN 0 stage IA Oncotype DX score 9: Risk of recurrence 7% Adjuvant radiation therapy 07/13/2016 to 08/11/2016  Treatment plan: Adjuvant antiestrogen therapy with anastrozole 1 mg by mouth daily Started 08/2016  Anastrozole toxicities: Denies any hot flashes or myalgias  Breast Cancer Surveillance: 1. Breast exam 07/02/20: Normal 2. Mammogram 05/26/20: no abnormalities. Postsurgical changes. Breast Density Category C.   Broken ribs: Still hurting  Return to clinic in 1 year for follow-up     No orders of the defined types were placed in this encounter.  The patient has a good understanding of the overall plan. she agrees with it. she will call with any problems that may develop before the next visit here.  Total time spent: 20 mins including face to face time and time spent for planning, charting and coordination of care  Rulon Eisenmenger, MD, MPH 07/03/2020  I, Molly Dorshimer, am acting as scribe for Dr. Nicholas Lose.  I have reviewed the above documentation for accuracy and completeness, and I agree with the above.

## 2020-07-03 ENCOUNTER — Other Ambulatory Visit: Payer: Self-pay

## 2020-07-03 ENCOUNTER — Inpatient Hospital Stay: Payer: 59 | Attending: Hematology and Oncology | Admitting: Hematology and Oncology

## 2020-07-03 DIAGNOSIS — C50212 Malignant neoplasm of upper-inner quadrant of left female breast: Secondary | ICD-10-CM | POA: Diagnosis not present

## 2020-07-03 DIAGNOSIS — Z79811 Long term (current) use of aromatase inhibitors: Secondary | ICD-10-CM | POA: Insufficient documentation

## 2020-07-03 DIAGNOSIS — Z170 Estrogen receptor positive status [ER+]: Secondary | ICD-10-CM | POA: Insufficient documentation

## 2020-07-03 DIAGNOSIS — M8580 Other specified disorders of bone density and structure, unspecified site: Secondary | ICD-10-CM | POA: Diagnosis not present

## 2020-07-03 MED ORDER — ALENDRONATE SODIUM 70 MG PO TABS: 70.0000 mg | ORAL_TABLET | ORAL | 4 refills | Status: DC

## 2020-07-03 MED ORDER — ANASTROZOLE 1 MG PO TABS: 1.0000 mg | ORAL_TABLET | Freq: Every day | ORAL | 4 refills | Status: DC

## 2020-07-03 MED ORDER — ACETAMINOPHEN 500 MG PO TABS: 1000.0000 mg | ORAL_TABLET | Freq: Two times a day (BID) | ORAL | 0 refills | Status: DC

## 2020-07-04 ENCOUNTER — Ambulatory Visit
Admission: RE | Admit: 2020-07-04 | Discharge: 2020-07-04 | Disposition: A | Discharge status: Home / Self Care | Payer: 59 | Source: Ambulatory Visit | Attending: Family Medicine | Admitting: Family Medicine

## 2020-07-04 ENCOUNTER — Other Ambulatory Visit: Payer: Self-pay | Admitting: Family Medicine

## 2020-07-04 DIAGNOSIS — R0781 Pleurodynia: Secondary | ICD-10-CM

## 2021-02-21 ENCOUNTER — Encounter (HOSPITAL_BASED_OUTPATIENT_CLINIC_OR_DEPARTMENT_OTHER): Payer: Self-pay

## 2021-02-21 ENCOUNTER — Other Ambulatory Visit: Payer: Self-pay

## 2021-02-21 ENCOUNTER — Emergency Department (HOSPITAL_BASED_OUTPATIENT_CLINIC_OR_DEPARTMENT_OTHER)
Admission: EM | Admit: 2021-02-21 | Discharge: 2021-02-21 | Disposition: A | Discharge status: Home / Self Care | Payer: 59 | Attending: Emergency Medicine | Admitting: Emergency Medicine

## 2021-02-21 ENCOUNTER — Emergency Department (HOSPITAL_BASED_OUTPATIENT_CLINIC_OR_DEPARTMENT_OTHER): Payer: 59

## 2021-02-21 DIAGNOSIS — Z87891 Personal history of nicotine dependence: Secondary | ICD-10-CM | POA: Insufficient documentation

## 2021-02-21 DIAGNOSIS — R42 Dizziness and giddiness: Secondary | ICD-10-CM | POA: Diagnosis not present

## 2021-02-21 DIAGNOSIS — R001 Bradycardia, unspecified: Secondary | ICD-10-CM | POA: Diagnosis present

## 2021-02-21 DIAGNOSIS — Z853 Personal history of malignant neoplasm of breast: Secondary | ICD-10-CM | POA: Insufficient documentation

## 2021-02-21 DIAGNOSIS — Z7982 Long term (current) use of aspirin: Secondary | ICD-10-CM | POA: Insufficient documentation

## 2021-02-21 LAB — BASIC METABOLIC PANEL
Anion gap: 7 (ref 5–15)
BUN: 14 mg/dL (ref 8–23)
CO2: 27 mmol/L (ref 22–32)
Calcium: 9.5 mg/dL (ref 8.9–10.3)
Chloride: 104 mmol/L (ref 98–111)
Creatinine, Ser: 0.51 mg/dL (ref 0.44–1.00)
GFR, Estimated: >60 mL/min (ref >60)
Glucose, Bld: 143 mg/dL — ABNORMAL HIGH (ref 70–99)
Potassium: 3.7 mmol/L (ref 3.5–5.1)
Sodium: 138 mmol/L (ref 135–145)

## 2021-02-21 LAB — CBC
HCT: 36.2 % (ref 36.0–46.0)
Hemoglobin: 12 g/dL (ref 12.0–15.0)
MCH: 31.9 pg (ref 26.0–34.0)
MCHC: 33.1 g/dL (ref 30.0–36.0)
MCV: 96.3 fL (ref 80.0–100.0)
Platelets: 202 10*3/uL (ref 150–400)
RBC: 3.76 MIL/uL — ABNORMAL LOW (ref 3.87–5.11)
RDW: 12.6 % (ref 11.5–15.5)
WBC: 7.3 10*3/uL (ref 4.0–10.5)
nRBC: 0 % (ref 0.0–0.2)

## 2021-02-21 LAB — TROPONIN I (HIGH SENSITIVITY): Troponin I (High Sensitivity): 3 ng/L (ref <18)

## 2021-02-21 NOTE — Discharge Instructions (Addendum)
Your work-up looks okay today.  -Your chest x-ray shows signs of COPD.  You may speak to your primary care provider about this. -Because of your lower heart rate and lightheaded episodes, it is important for you to follow-up with cardiology.  I have attached an office to your papers and you may also let your primary care provider direct you to any other office next week.  Information about low heart rates is attached to these discharge papers.  Please continue to keep track and return if your symptoms worsen.

## 2021-02-21 NOTE — ED Provider Notes (Signed)
[facility] EMERGENCY DEPT Provider Note   CSN: [id number] Arrival date & time: 02/21/21  1632     History Chief Complaint  Patient presents with   Back Pain    Candice Caldwell is a 65 y.o. female with a past medical history of breast cancer s/p lumpectomy and radiation in 2012 presenting today with a complaint of bradycardia and lightheadedness.  She reports that earlier today she was feeling a bit lightheaded and had been struggling with pain between her shoulder blades for the past 2 weeks.  She utilized her pulse ox which told her that her heart rate was around 50, at which time she was also lightheaded.  No palpitations or chest pain.  Has not been experiencing shortness of breath.  Reports a "variable but many year history of smoking."  No personal history of cardiac illness however reports she has uncles and aunts with heart disease.  The pain between her shoulder blades has been going on for 2 weeks and is worse in the morning and late in the evening.  Has a history of scoliosis as well as a spinal fusion performed over 40 years ago.  Naproxen has relieved this pain as well as an ice pack.  She is unable to elicit this pain but describes it as "dull."  States that the reason for her visit today was her lightheadedness and bradycardia.  Past Medical History:  Diagnosis Date   Breast cancer ([place]) 04/2016   Left Breast   Cancer ([place]) 04/2016   left breast cancer   History of radiation therapy 07/13/16-08/11/16   left breast 42.5 Gy in 16 fractions, left breast boost 10 Gy in 5 fractions    Patient Active Problem List   Diagnosis Date Noted   Malignant neoplasm of upper-inner quadrant of left breast in female, estrogen receptor positive ([place]) 05/05/2016    Past Surgical History:  Procedure Laterality Date   BREAST LUMPECTOMY Left 2018   BREAST LUMPECTOMY WITH RADIOACTIVE SEED AND SENTINEL LYMPH NODE BIOPSY Left 06/04/2016   Procedure: LEFT BREAST LUMPECTOMY WITH  RADIOACTIVE SEED AND SENTINEL LYMPH NODE BIOPSY;  Surgeon: Alphonsa Overall, MD;  Location: [facility];  Service: General;  Laterality: Left;   EYE SURGERY     lasik   SPINAL FUSION       OB History   No obstetric history on file.     No family history on file.  Social History   Tobacco Use   Smoking status: Former    Types: Cigarettes    Quit date: 04/30/2016    Years since quitting: 4.8   Smokeless tobacco: Never  Substance Use Topics   Alcohol use: Yes    Comment: 6-8 week glasses of wine   Drug use: No    Home Medications Prior to Admission medications   Medication Sig Start Date End Date Taking? Authorizing Provider  acetaminophen (TYLENOL) 500 MG tablet Take 2 tablets (1,000 mg total) by mouth in the morning and at bedtime. 07/03/20   Nicholas Lose, MD  alendronate (FOSAMAX) 70 MG tablet Take 1 tablet (70 mg total) by mouth once a week. Take with a full glass of water on an empty stomach. 07/03/20   Nicholas Lose, MD  anastrozole (ARIMIDEX) 1 MG tablet Take 1 tablet (1 mg total) by mouth daily. 07/03/20   Nicholas Lose, MD  aspirin 81 MG tablet Take 81 mg by mouth daily.    [provider]  MULTIPLE VITAMIN PO Take by mouth.  [provider]  Multiple Vitamins-Minerals (OCUVITE EXTRA) TABS Take 1 tablet by mouth daily.    [provider]  Omega-3 Fatty Acids (FISH OIL CONCENTRATE PO) Take 1 tablet by mouth daily.    [provider]    Allergies    Patient has no known allergies.  Review of Systems   Review of Systems  Constitutional:  Negative for chills.  Respiratory:  Negative for shortness of breath.   Cardiovascular:  Negative for chest pain and palpitations.  Gastrointestinal:  Negative for nausea and vomiting.  Musculoskeletal:  Positive for back pain. Negative for myalgias.  Neurological:  Positive for light-headedness. Negative for syncope, weakness and headaches.  All other systems reviewed and are  negative.  Physical Exam Updated Vital Signs BP (!) 176/106 (BP Location: Right Arm)    Pulse 84    Temp 98 F (36.7 C) (Oral)    Resp (!) 22    Ht 5\' 8"  (1.727 m)    Wt 58.5 kg    SpO2 98%    BMI 19.61 kg/m   Physical Exam Vitals and nursing note reviewed.  Constitutional:      Appearance: Normal appearance. She is not ill-appearing.  HENT:     Head: Normocephalic and atraumatic.  Eyes:     General: No scleral icterus.    Conjunctiva/sclera: Conjunctivae normal.  Cardiovascular:     Rate and Rhythm: Normal rate and regular rhythm.  Pulmonary:     Effort: Pulmonary effort is normal. No respiratory distress.     Breath sounds: No wheezing or rales.  Musculoskeletal:        General: No swelling or tenderness. Normal range of motion.     Comments: Normal range of motion of upper extremities.  No tenderness to palpation.  Negative apprehension test and 5 out of 5 strength in extremities bilaterally.  Skin:    General: Skin is warm and dry.     Findings: No rash.  Neurological:     Mental Status: She is alert.  Psychiatric:        Mood and Affect: Mood normal.    ED Results / Procedures / Treatments   Labs (all labs ordered are listed, but only abnormal results are displayed) Labs Reviewed  CBC - Abnormal; Notable for the following components:      Result Value   RBC 3.76 (*)    All other components within normal limits  BASIC METABOLIC PANEL  TROPONIN I (HIGH SENSITIVITY)    EKG EKG Interpretation  Date/Time:  Friday February 21 2021 16:53:26 EST Ventricular Rate:  73 PR Interval:  182 QRS Duration: 92 QT Interval:  372 QTC Calculation: 409 R Axis:   58 Text Interpretation: Normal sinus rhythm Incomplete right bundle branch block Minimal voltage criteria for LVH, may be normal variant ( Sokolow-Lyon ) Anteroseptal infarct , age undetermined Abnormal ECG No old tracing to compare Confirmed by Isla Pence [phone]) on 02/21/2021 5:37:14 PM  Radiology DG Chest  Portable 1 View  Result Date: 02/21/2021 CLINICAL DATA:  Chest pain EXAM: PORTABLE CHEST 1 VIEW COMPARISON:  07/04/2020 FINDINGS: Cardiac shadow is stable. Lungs are hyperinflated but clear. Old rib fractures are noted on the right with healing. Fixation rods are noted in the thoracic spine with chronic fractures. Stable scoliosis concave to the left is noted. No acute bony abnormality is noted. IMPRESSION: Chronic changes without acute abnormality. COPD. Electronically Signed   By: Inez Catalina M.D.   On: 02/21/2021 17:21  Procedures Procedures   Medications Ordered in ED Medications - No data to display  ED Course  I have reviewed the triage vital signs and the nursing notes.  Pertinent labs & imaging results that were available during my care of the patient were reviewed by me and considered in my medical decision making (see chart for details).    MDM Rules/Calculators/A&P 65 year old female with no pertinent past medical history presenting today with the complaint of low heart rate and occasional lightheadedness.  Also notes some back pain.  She reported that she had been seen some heart rate readings in the 50s, lowest being 49 at which time she also became lightheaded.  No history of cardiac illness.  Concern for symptomatic bradycardia.  Patient has not established with a cardiologist.  Work-up: -EKG in sinus with a normal rate.  Confirmed by MD Gilford Raid.  Troponin was negative.  -Chest x-ray revealing of COPD, we discussed this and she reported an extensive smoking history secondary to stress however she is no longer smoking. -General lab work unremarkable. -Throughout my evaluations the lowest heart rate I was able to capture was 55.  Conclusion: -Patient with episodes of bradycardia and associated lightheadedness.  Currently asymptomatic.  I believe she needs to get established with a cardiologist for these incidents.  She has an upcoming appointment with her primary care  provider on Tuesday who will be able to fully evaluate her heart rate and EKG at that time.  She has also been referred to our on-call cardiologist to be established and potentially wear a Holter monitor. -Patient is agreeable to this plan.  Heart score of 2 and no need for admission at this time.  She did not have any tenderness or reason for me to image her back or shoulder.  She also reported that she was most concerned about her bradycardia.  It is possible that her bradycardia is a result of radiation therapy for her breast cancer in 2018 however it is important for her to follow-up with primary care and cardiology at this time.  Ambulatory and stable for discharge.  Final Clinical Impression(s) / ED Diagnoses Final diagnoses:  Bradycardia    Rx / DC Orders Results and diagnoses were explained to the patient. Return precautions discussed in full. Patient had no additional questions and expressed complete understanding.     Darliss Ridgel 02/21/21 1849    Isla Pence, MD 02/21/21 1904

## 2021-02-21 NOTE — ED Triage Notes (Signed)
Patient here POV from Home with Back Pain.  Patient has been having Upper Back Pain for approximately 2 weeks. Patient visited Chiropractor and has been utilizing Ice and OTC Medication.  Patient states it began radiating to Left Arm more recently.   NAD Noted during Triage. A&Ox4. GCS 15. Ambulatory.

## 2021-03-12 ENCOUNTER — Other Ambulatory Visit: Payer: Self-pay | Admitting: Hematology and Oncology

## 2021-03-12 ENCOUNTER — Other Ambulatory Visit: Payer: Self-pay | Admitting: Critical Care Medicine

## 2021-03-12 DIAGNOSIS — Z1231 Encounter for screening mammogram for malignant neoplasm of breast: Secondary | ICD-10-CM

## 2021-03-26 NOTE — Progress Notes (Signed)
Cardiology Office Note:   Date:  03/27/2021  NAME:  Candice Caldwell    MRN: [id number] DOB:  1955/12/08   PCP:  Lujean Amel, MD  Cardiologist:  None  Electrophysiologist:  None   Referring MD: Lujean Amel, MD   Chief Complaint  Patient presents with   Follow-up         History of Present Illness:   Candice Caldwell is a 66 y.o. female with a hx of breast CA who is being seen today for the evaluation of dizziness at the request of Koirala, Dibas, MD. Evaluated in the ER 02/21/2021 for dizziness. BP severely elevated. Concerns for bradycardia but EKG normal.  She reports when she was seen in the emergency room she was having symptoms of dizziness as well as lightheadedness.  Apparently she was drinking lots of caffeine and had poor sleep.  She was also suffering from a tooth infection and on an antibiotic.  She reports that she had a pulse oximeter and her pulse was low and high.  This was bothersome to her.  She went to the emergency room.  Work-up there was unremarkable.  Recent TSH 0.68.  She reports no symptoms of chest pain or trouble breathing.  Her symptoms have resolved.  She reports this was a combination of caffeine consumption, poor sleep and being on an antibiotic.  She also had pain in her shoulder blade.  All of her symptoms have resolved.  She is had no further episodes.  She is walking which includes hiking 4 times per week.  She reports 5 to 6 miles per session.  No chest pain or trouble breathing.  She is a former smoker of roughly 30 years.  She works as a Cabin crew.  She has 2 children.  She reports that her father also was in skilled nursing and this was stressful at the time.  Symptoms have improved with him being back in a facility with better care.  Her father does have a history of heart disease.  She has never had a heart attack or stroke.  She does have a history of breast cancer.  She is never had any heart issues.  Her cardiovascular examination is normal.  EKG demonstrates  sinus rhythm with an incomplete right bundle branch block.  Past Medical History: Past Medical History:  Diagnosis Date   Breast cancer ([facility]) 04/2016   Left Breast   Cancer ([facility]) 04/2016   left breast cancer   History of radiation therapy 07/13/16-08/11/16   left breast 42.5 Gy in 16 fractions, left breast boost 10 Gy in 5 fractions    Past Surgical History: Past Surgical History:  Procedure Laterality Date   BREAST LUMPECTOMY Left 2018   BREAST LUMPECTOMY WITH RADIOACTIVE SEED AND SENTINEL LYMPH NODE BIOPSY Left 06/04/2016   Procedure: LEFT BREAST LUMPECTOMY WITH RADIOACTIVE SEED AND SENTINEL LYMPH NODE BIOPSY;  Surgeon: Alphonsa Overall, MD;  Location: [facility];  Service: General;  Laterality: Left;   EYE SURGERY     lasik   SPINAL FUSION      Current Medications: Current Meds  Medication Sig   acetaminophen (TYLENOL) 500 MG tablet Take 2 tablets (1,000 mg total) by mouth in the morning and at bedtime.   alendronate (FOSAMAX) 70 MG tablet Take 1 tablet (70 mg total) by mouth once a week. Take with a full glass of water on an empty stomach.   anastrozole (ARIMIDEX) 1 MG tablet Take 1 tablet (1 mg total) by mouth  daily.   ascorbic acid (VITAMIN C) 500 MG tablet Take 500 mg by mouth daily in the afternoon.   Calcium Carbonate-Vitamin D 600-5 MG-MCG TABS Take 1 tablet by mouth daily.   Magnesium 250 MG TABS Take 250 mg by mouth daily in the afternoon.   MULTIPLE VITAMIN PO Take by mouth.   Multiple Vitamins-Minerals (OCUVITE EXTRA) TABS Take 1 tablet by mouth daily.   Omega-3 Fatty Acids (FISH OIL CONCENTRATE PO) Take 1 tablet by mouth daily.   traZODone (DESYREL) 100 MG tablet Take 100 mg by mouth at bedtime.     Allergies:    Patient has no known allergies.   Social History: Social History   Socioeconomic History   Marital status: Single    Spouse name: Not on file   Number of children: 2   Years of education: Not on file   Highest education level: Not on  file  Occupational History   Occupation: Realtor  Tobacco Use   Smoking status: Former    Packs/day: 0.50    Years: 30.00    Pack years: 15.00    Types: Cigarettes    Quit date: 04/30/2016    Years since quitting: 4.9   Smokeless tobacco: Never  Substance and Sexual Activity   Alcohol use: Yes    Comment: 6-8 week glasses of wine   Drug use: No   Sexual activity: Not on file  Other Topics Concern   Not on file  Social History Narrative   Not on file   Social Determinants of Health   Financial Resource Strain: Not on file  Food Insecurity: Not on file  Transportation Needs: Not on file  Physical Activity: Not on file  Stress: Not on file  Social Connections: Not on file     Family History: The patient's family history includes Heart attack in her father.  ROS:   All other ROS reviewed and negative. Pertinent positives noted in the HPI.     EKGs/Labs/Other Studies Reviewed:   The following studies were personally reviewed by me today:  EKG:  EKG is ordered today.  The ekg ordered today demonstrates normal sinus rhythm, incomplete right bundle branch block, septal infarct, and was personally reviewed by me.   Recent Labs: 02/21/2021: BUN 14; Creatinine, Ser 0.51; Hemoglobin 12.0; Platelets 202; Potassium 3.7; Sodium 138   Recent Lipid Panel No results found for: CHOL, TRIG, HDL, CHOLHDL, VLDL, LDLCALC, LDLDIRECT  Physical Exam:   VS:  BP 119/71 (BP Location: Right Arm, Patient Position: Sitting, Cuff Size: Small)    Pulse 65    Ht 5\' 6"  (1.676 m)    Wt 128 lb 3.2 oz (58.2 kg)    SpO2 98%    BMI 20.69 kg/m    Wt Readings from Last 3 Encounters:  03/27/21 128 lb 3.2 oz (58.2 kg)  02/21/21 128 lb 15.5 oz (58.5 kg)  07/03/20 128 lb 14.4 oz (58.5 kg)    General: Well nourished, well developed, in no acute distress Head: Atraumatic, normal size  Eyes: PEERLA, EOMI  Neck: Supple, no JVD Endocrine: No thryomegaly Cardiac: Normal S1, S2; RRR; no murmurs, rubs, or  gallops Lungs: Clear to auscultation bilaterally, no wheezing, rhonchi or rales  Abd: Soft, nontender, no hepatomegaly  Ext: No edema, pulses 2+ Musculoskeletal: No deformities, BUE and BLE strength normal and equal Skin: Warm and dry, no rashes   Neuro: Alert and oriented to person, place, time, and situation, CNII-XII grossly intact, no focal deficits  Psych: Normal  mood and affect   ASSESSMENT:   Candice Caldwell is a 66 y.o. female who presents for the following: 1. Dizziness   2. Bradycardia   3. Family history of heart disease     PLAN:   1. Dizziness 2. Bradycardia -Episode of dizziness and bradycardia occurred in December.  This was in the setting of poor sleep, increased caffeine consumption and a tooth infection.  She was on antibiotic.  Her symptoms have all resolved.  She also reported pain in her shoulder blades due to prior spinal fusion surgery.  Her symptoms have resolved.  Her blood pressure is well controlled.  She is had no further episodes.  She can now walk and do hiking up to 5 to 6 miles with no limitations.  Cardiovascular examination is normal.  Her EKG also demonstrates an incomplete right bundle branch block which is a normal finding.  Recent thyroid studies are normal.  She is not anemic.  Overall I suspect this was just a combination of stress poor sleep and being on antibiotics.  I see no need for further cardiac testing on this.  All of her work-up thus far has been negative and her cardiovascular examination is normal.  She will continue to monitor symptoms.  For now we will defer cardiac testing.  3. Family history of heart disease -Most recent lipid profile shows a total cholesterol 220, HDL 134, LDL 69, triglycerides 107.  Is extremely active hiking up to 5 to 6 miles with no chest pain or trouble breathing.  EKG demonstrates an incomplete right bundle branch block.  We discussed calcium scoring for further restratification.  She is interested in this.  We will  pursue this and follow-up will be dictated on the results of the scan.      Disposition: Return if symptoms worsen or fail to improve.  Medication Adjustments/Labs and Tests Ordered: Current medicines are reviewed at length with the patient today.  Concerns regarding medicines are outlined above.  Orders Placed This Encounter  Procedures   CT CARDIAC SCORING (SELF PAY ONLY)   EKG 12-Lead   No orders of the defined types were placed in this encounter.   Patient Instructions  Medication Instructions:  The current medical regimen is effective;  continue present plan and medications.  *If you need a refill on your cardiac medications before your next appointment, please call your pharmacy*   Testing/Procedures: CALCIUM SCORE   Follow-Up: At [facility], you and your health needs are our priority.  As part of our continuing mission to provide you with exceptional heart care, we have created designated Provider Care Teams.  These Care Teams include your primary Cardiologist (physician) and Advanced Practice Providers (APPs -  Physician Assistants and Nurse Practitioners) who all work together to provide you with the care you need, when you need it.  We recommend signing up for the patient portal called "MyChart".  Sign up information is provided on this After Visit Summary.  MyChart is used to connect with patients for Virtual Visits (Telemedicine).  Patients are able to view lab/test results, encounter notes, upcoming appointments, etc.  Non-urgent messages can be sent to your provider as well.   To learn more about what you can do with MyChart, go to [email or web address].    Your next appointment:   As needed  The format for your next appointment:   In Person  Provider:   Eleonore Chiquito, MD       Signed, Addison Naegeli. Audie Box, MD, [facility]  [facility]  [address], [place], [place] [address] [phone]  03/27/2021 4:18 PM

## 2021-03-27 ENCOUNTER — Encounter: Payer: Self-pay | Admitting: Cardiovascular Disease

## 2021-03-27 ENCOUNTER — Other Ambulatory Visit: Payer: Self-pay

## 2021-03-27 ENCOUNTER — Ambulatory Visit (INDEPENDENT_AMBULATORY_CARE_PROVIDER_SITE_OTHER): Payer: 59 | Admitting: Cardiovascular Disease

## 2021-03-27 VITALS — BP 119/71 | HR 65 | Ht 66.0 in | Wt 128.2 lb

## 2021-03-27 DIAGNOSIS — R001 Bradycardia, unspecified: Secondary | ICD-10-CM

## 2021-03-27 DIAGNOSIS — R42 Dizziness and giddiness: Secondary | ICD-10-CM | POA: Diagnosis not present

## 2021-03-27 DIAGNOSIS — Z8249 Family history of ischemic heart disease and other diseases of the circulatory system: Secondary | ICD-10-CM | POA: Diagnosis not present

## 2021-03-27 NOTE — Patient Instructions (Signed)
Medication Instructions:  The current medical regimen is effective;  continue present plan and medications.  *If you need a refill on your cardiac medications before your next appointment, please call your pharmacy*   Testing/Procedures: CALCIUM SCORE   Follow-Up: At [facility], you and your health needs are our priority.  As part of our continuing mission to provide you with exceptional heart care, we have created designated Provider Care Teams.  These Care Teams include your primary Cardiologist (physician) and Advanced Practice Providers (APPs -  Physician Assistants and Nurse Practitioners) who all work together to provide you with the care you need, when you need it.  We recommend signing up for the patient portal called "MyChart".  Sign up information is provided on this After Visit Summary.  MyChart is used to connect with patients for Virtual Visits (Telemedicine).  Patients are able to view lab/test results, encounter notes, upcoming appointments, etc.  Non-urgent messages can be sent to your provider as well.   To learn more about what you can do with MyChart, go to [email or web address].    Your next appointment:   As needed  The format for your next appointment:   In Person  Provider:   Enfield O'Neal, MD     

## 2021-04-02 ENCOUNTER — Other Ambulatory Visit: Payer: Self-pay | Admitting: *Deleted

## 2021-04-02 DIAGNOSIS — Z87891 Personal history of nicotine dependence: Secondary | ICD-10-CM

## 2021-04-16 ENCOUNTER — Ambulatory Visit (INDEPENDENT_AMBULATORY_CARE_PROVIDER_SITE_OTHER): Payer: 59 | Admitting: Acute Care

## 2021-04-16 ENCOUNTER — Other Ambulatory Visit: Payer: Self-pay

## 2021-04-16 ENCOUNTER — Encounter: Payer: Self-pay | Admitting: Acute Care

## 2021-04-16 DIAGNOSIS — Z87891 Personal history of nicotine dependence: Secondary | ICD-10-CM | POA: Diagnosis not present

## 2021-04-16 NOTE — Patient Instructions (Signed)
Thank you for participating in the [facility] Screening Program. °It was our pleasure to meet you today. °We will call you with the results of your scan within the next few days. °Your scan will be assigned a Lung RADS category score by the physicians reading the scans.  °This Lung RADS score determines follow up scanning.  °See below for description of categories, and follow up screening recommendations. °We will be in touch to schedule your follow up screening annually or based on recommendations of our providers. °We will fax a copy of your scan results to your Primary Care Physician, or the physician who referred you to the program, to ensure they have the results. °Please call the office if you have any questions or concerns regarding your scanning experience or results.  °Our office number is [phone]. °Please speak with Denise Phelps, RN. She is our Lung Cancer Screening RN. °If she is unavailable when you call, please have the office staff send her a message. She will return your call at her earliest convenience. °Remember, if your scan is normal, we will scan you annually as long as you continue to meet the criteria for the program. (Age 55-77, Current smoker or smoker who has quit within the last 15 years). °If you are a smoker, remember, quitting is the single most powerful action that you can take to decrease your risk of lung cancer and other pulmonary, breathing related problems. °We know quitting is hard, and we are here to help.  °Please let us know if there is anything we can do to help you meet your goal of quitting. °If you are a former smoker, congratulations. We are proud of you! Remain smoke free! °Remember you can refer friends or family members through the number above.  °We will screen them to make sure they meet criteria for the program. °Thank you for helping us take better care of you by participating in Lung Screening. ° °You can receive free nicotine replacement therapy  ( patches, gum or mints) by calling 1-800-QUIT NOW. Please call so we can get you on the path to becoming  a non-smoker. I know it is hard, but you can do this! ° °Lung RADS Categories: ° °Lung RADS 1: no nodules or definitely non-concerning nodules.  °Recommendation is for a repeat annual scan in 12 months. ° °Lung RADS 2:  nodules that are non-concerning in appearance and behavior with a very low likelihood of becoming an active cancer. °Recommendation is for a repeat annual scan in 12 months. ° °Lung RADS 3: nodules that are probably non-concerning , includes nodules with a low likelihood of becoming an active cancer.  Recommendation is for a 6-month repeat screening scan. Often noted after an upper respiratory illness. We will be in touch to make sure you have no questions, and to schedule your 6-month scan. ° °Lung RADS 4 A: nodules with concerning findings, recommendation is most often for a follow up scan in 3 months or additional testing based on our provider's assessment of the scan. We will be in touch to make sure you have no questions and to schedule the recommended 3 month follow up scan. ° °Lung RADS 4 B:  indicates findings that are concerning. We will be in touch with you to schedule additional diagnostic testing based on our provider's  assessment of the scan. ° °Hypnosis for smoking cessation  °[employer] °[phone] ° °Acupuncture for smoking cessation  °[facility] °[phone]  °

## 2021-04-16 NOTE — Progress Notes (Signed)
Virtual Visit via Telephone Note  I connected with Candice Caldwell on 04/16/21 at 11:00 AM EST by telephone and verified that I am speaking with the correct person using two identifiers.  Location: Patient:  At home Provider:  [place], [place], [place], [address]    I discussed the limitations, risks, security and privacy concerns of performing an evaluation and management service by telephone and the availability of in person appointments. I also discussed with the patient that there may be a patient responsible charge related to this service. The patient expressed understanding and agreed to proceed.    Shared Decision Making Visit Lung Cancer Screening Program [phone])   Eligibility: Age 66 y.o. Pack Years Smoking History Calculation 25 pack year smoking history (# packs/per year x # years smoked) Recent History of coughing up blood  no Unexplained weight loss? no ( >Than 15 pounds within the last 6 months ) Prior History Lung / other cancer no (Diagnosis within the last 5 years already requiring surveillance chest CT Scans). Smoking Status Former Smoker Former Smokers: Years since quit: 5 years  Quit Date:  NA  Visit Components: Discussion included one or more decision making aids. yes Discussion included risk/benefits of screening. yes Discussion included potential follow up diagnostic testing for abnormal scans. yes Discussion included meaning and risk of over diagnosis. yes Discussion included meaning and risk of False Positives. yes Discussion included meaning of total radiation exposure. yes  Counseling Included: Importance of adherence to annual lung cancer LDCT screening. yes Impact of comorbidities on ability to participate in the program. yes Ability and willingness to under diagnostic treatment. yes  Smoking Cessation Counseling: Current Smokers:  Discussed importance of smoking cessation. yes Information about tobacco cessation classes and  interventions provided to patient. yes Patient provided with "ticket" for LDCT Scan. yes Symptomatic Patient. no  Counseling NA Diagnosis Code: Tobacco Use Z72.0 Asymptomatic Patient yes  Counseling (Intermediate counseling: > three minutes counseling) H2992 Former Smokers:  Discussed the importance of maintaining cigarette abstinence. yes Diagnosis Code: Personal History of Nicotine Dependence. E26.834 Information about tobacco cessation classes and interventions provided to patient. Yes Patient provided with "ticket" for LDCT Scan. yes Written Order for Lung Cancer Screening with LDCT placed in Epic. Yes (CT Chest Lung Cancer Screening Low Dose W/O CM) [id number] Z12.2-Screening of respiratory organs Z87.891-Personal history of nicotine dependence  I spent 25 minutes of face to face time/virtual visit time  with  Mr. Candice Caldwell discussing the risks and benefits of lung cancer screening. We took the time to pause the power point at intervals to allow for questions to be asked and answered to ensure understanding. We discussed that he had taken the single most powerful action possible to decrease his risk of developing lung cancer when he quit smoking. I counseled him to remain smoke free, and to contact me if he ever had the desire to smoke again so that I can provide resources and tools to help support the effort to remain smoke free. We discussed the time and location of the scan, and that either  Doroteo Glassman RN, Joella Prince, RN or I  or I will call / send a letter with the results within  24-72 hours of receiving them. He has the office contact information in the event he needs to speak with me,  he verbalized understanding of all of the above and had no further questions upon leaving the office.     I explained to the patient that  there has been a high incidence of coronary artery disease noted on these exams. I explained that this is a non-gated exam therefore degree or severity cannot be  determined. This patient is not on statin therapy. I have asked the patient to follow-up with their PCP regarding any incidental finding of coronary artery disease and management with diet or medication as they feel is clinically indicated. The patient verbalized understanding of the above and had no further questions.     Magdalen Spatz, NP 04/16/2021

## 2021-04-18 ENCOUNTER — Ambulatory Visit (INDEPENDENT_AMBULATORY_CARE_PROVIDER_SITE_OTHER): Payer: 59

## 2021-04-18 ENCOUNTER — Ambulatory Visit: Payer: 59

## 2021-04-18 ENCOUNTER — Other Ambulatory Visit: Payer: Self-pay

## 2021-04-18 DIAGNOSIS — Z87891 Personal history of nicotine dependence: Secondary | ICD-10-CM

## 2021-04-18 DIAGNOSIS — Z122 Encounter for screening for malignant neoplasm of respiratory organs: Secondary | ICD-10-CM

## 2021-04-21 ENCOUNTER — Other Ambulatory Visit: Payer: Self-pay

## 2021-04-21 DIAGNOSIS — Z87891 Personal history of nicotine dependence: Secondary | ICD-10-CM

## 2021-04-23 ENCOUNTER — Telehealth: Payer: Self-pay | Admitting: Acute Care

## 2021-04-23 NOTE — Telephone Encounter (Signed)
I called the patient to give her the results of her low doe CT Chest. She had called the office and wanted [place] to call her with the results. She did answer the phone, but as she was walking into a meeting , she requested that we not discuss her results at the present time. She will call the [phone] number tomorrow , she will leave a message so that we can call her back again with her results.

## 2021-04-24 ENCOUNTER — Telehealth: Payer: Self-pay | Admitting: Acute Care

## 2021-04-24 NOTE — Telephone Encounter (Signed)
CT results were faxed to PCP. Order has been placed for 12 mth f/u low dose Lung CT.

## 2021-04-24 NOTE — Telephone Encounter (Signed)
See telephone note 04/24/21

## 2021-04-24 NOTE — Telephone Encounter (Signed)
I have called the patient with the results of her low dose CT Chest. Her scan was read asa Lung RADS 1, negative study: no nodules or definitely benign nodules. Radiology recommendation is for a repeat LDCT in 12 months.  She will need an annual screening again 04/2022.  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 This does not meet unenhanced CT criteria for a benign cyst. Recommend further evaluation with non emergent, contrast enhanced liver MRI. I have explained that I will communicate this with her PCP's office so they can get this scheduled.  I have also told her to follow up with their office to ensure this gets done. She stated she will do this.   I called [facility]. I have left a VM with the above information requesting that they order the MRI liver if they agree that this is the appropriate study for follow up.   Langley Gauss, please place order for 12 month annual scan and fax results to PCP. Thanks so much

## 2021-05-07 ENCOUNTER — Other Ambulatory Visit: Payer: Self-pay | Admitting: Family Medicine

## 2021-05-07 DIAGNOSIS — R160 Hepatomegaly, not elsewhere classified: Secondary | ICD-10-CM

## 2021-05-12 ENCOUNTER — Ambulatory Visit (INDEPENDENT_AMBULATORY_CARE_PROVIDER_SITE_OTHER)
Admission: RE | Admit: 2021-05-12 | Discharge: 2021-05-12 | Disposition: A | Discharge status: Home / Self Care | Payer: Self-pay | Source: Ambulatory Visit | Attending: Cardiovascular Disease | Admitting: Cardiovascular Disease

## 2021-05-12 ENCOUNTER — Ambulatory Visit: Payer: 59

## 2021-05-12 ENCOUNTER — Other Ambulatory Visit: Payer: Self-pay

## 2021-05-12 DIAGNOSIS — R160 Hepatomegaly, not elsewhere classified: Secondary | ICD-10-CM

## 2021-05-12 DIAGNOSIS — Z8249 Family history of ischemic heart disease and other diseases of the circulatory system: Secondary | ICD-10-CM

## 2021-05-12 MED ORDER — GADOBUTROL 1 MMOL/ML IV SOLN
6.0000 mL | Freq: Once | INTRAVENOUS | Status: AC | PRN
  Administered 2021-05-12: 6 mL via INTRAVENOUS

## 2021-05-28 ENCOUNTER — Ambulatory Visit: Payer: Self-pay

## 2021-05-29 ENCOUNTER — Ambulatory Visit: Payer: Self-pay

## 2021-06-19 ENCOUNTER — Ambulatory Visit (INDEPENDENT_AMBULATORY_CARE_PROVIDER_SITE_OTHER): Payer: 59

## 2021-06-19 DIAGNOSIS — Z1231 Encounter for screening mammogram for malignant neoplasm of breast: Secondary | ICD-10-CM

## 2021-06-19 NOTE — Progress Notes (Incomplete)
? ?Patient Care Team: ?Lujean Amel, MD as PCP - General (Family Medicine) ?Alphonsa Overall, MD as Consulting Physician (General Surgery) ?Nicholas Lose, MD as Consulting Physician (Hematology and Oncology) ?Eppie Gibson, MD as Attending Physician (Radiation Oncology) ?Gardenia Phlegm, NP as Nurse Practitioner (Hematology and Oncology) ? ?DIAGNOSIS: No diagnosis found. ? ?SUMMARY OF ONCOLOGIC HISTORY: ?Oncology History  ?Malignant neoplasm of upper-inner quadrant of left breast in female, estrogen receptor positive ([place])  ?04/24/2016 Initial Diagnosis  ? Left breast biopsy 12:00: Benign, left breast biopsy 11:00: IDC low-grade, ER 100%, PR 95%, HER-2 negative ratio 1.68, Ki-67 5%; screening detected left breast distortion and calcifications UIQ 1.1 cm at 11:00 position axilla negative, T1 cN0 stage IA clinical stage ?  ?06/04/2016 Surgery  ? Left lumpectomy: IDC grade 1, 1.1 cm, margins negative, 0/6 lymph nodes negative, ER 100%, PR 95%, HER-2 negative ratio 1.68, Ki-67 5%, T1 CN 0 stage IA ?  ?06/17/2016 Oncotype testing  ? Oncotype DX score 9: Risk of recurrence 7% ?  ?07/13/2016 - 08/11/2016 Radiation Therapy  ? Adjuvant radiation therapy ?  ?09/03/2016 -  Anti-estrogen oral therapy  ? Anastrozole 1 mg daily ?  ? ? ?CHIEF COMPLIANT: Follow-up of left breast cancer on anastrozole therapy ? ?INTERVAL HISTORY: LINH HEDBERG is a 66 y.o. with above-mentioned history of left breast cancer treated with lumpectomy, adjuvant radiation, chemotherapy, and is currently on antiestrogen therapy with anastrozole.  ? ? ?ALLERGIES:  has No Known Allergies. ? ?MEDICATIONS:  ?Current Outpatient Medications  ?Medication Sig Dispense Refill  ? acetaminophen (TYLENOL) 500 MG tablet Take 2 tablets (1,000 mg total) by mouth in the morning and at bedtime. 30 tablet 0  ? alendronate (FOSAMAX) 70 MG tablet Take 1 tablet (70 mg total) by mouth once a week. Take with a full glass of water on an empty stomach. 12 tablet 4  ? anastrozole  (ARIMIDEX) 1 MG tablet Take 1 tablet (1 mg total) by mouth daily. 90 tablet 4  ? ascorbic acid (VITAMIN C) 500 MG tablet Take 500 mg by mouth daily in the afternoon.    ? aspirin 81 MG tablet Take 81 mg by mouth daily. (Patient not taking: Reported on 03/27/2021)    ? Calcium Carbonate-Vitamin D 600-5 MG-MCG TABS Take 1 tablet by mouth daily.    ? Magnesium 250 MG TABS Take 250 mg by mouth daily in the afternoon.    ? MULTIPLE VITAMIN PO Take by mouth.    ? Multiple Vitamins-Minerals (OCUVITE EXTRA) TABS Take 1 tablet by mouth daily.    ? Omega-3 Fatty Acids (FISH OIL CONCENTRATE PO) Take 1 tablet by mouth daily.    ? traZODone (DESYREL) 100 MG tablet Take 100 mg by mouth at bedtime.    ? ?No current facility-administered medications for this visit.  ? ? ?PHYSICAL EXAMINATION: ?ECOG PERFORMANCE STATUS: {CHL ONC ECOG [id number]} ? ?There were no vitals filed for this visit. ?There were no vitals filed for this visit. ? ?BREAST:*** No palpable masses or nodules in either right or left breasts. No palpable axillary supraclavicular or infraclavicular adenopathy no breast tenderness or nipple discharge. (exam performed in the presence of a chaperone) ? ?LABORATORY DATA:  ?I have reviewed the data as listed ? ?  Latest Ref Rng & Units 02/21/2021  ?  5:24 PM 05/06/2016  ?  8:22 AM  ?CMP  ?Glucose 70 - 99 mg/dL 143   114    ?BUN 8 - 23 mg/dL 14   14.5    ?  Creatinine 0.44 - 1.00 mg/dL 0.51   0.8    ?Sodium 135 - 145 mmol/L 138   138    ?Potassium 3.5 - 5.1 mmol/L 3.7   3.9    ?Chloride 98 - 111 mmol/L 104     ?CO2 22 - 32 mmol/L 27   24    ?Calcium 8.9 - 10.3 mg/dL 9.5   9.8    ?Total Protein 6.4 - 8.3 g/dL  7.6    ?Total Bilirubin 0.20 - 1.20 mg/dL  0.47    ?Alkaline Phos 40 - 150 U/L  79    ?AST 5 - 34 U/L  24    ?ALT 0 - 55 U/L  26    ? ? ?Lab Results  ?Component Value Date  ? WBC 7.3 02/21/2021  ? HGB 12.0 02/21/2021  ? HCT 36.2 02/21/2021  ? MCV 96.3 02/21/2021  ? PLT 202 02/21/2021  ? NEUTROABS 4.6 05/06/2016   ? ? ?ASSESSMENT & PLAN:  ?No problem-specific Assessment & Plan notes found for this encounter. ? ? ? ?No orders of the defined types were placed in this encounter. ? ?The patient has a good understanding of the overall plan. she agrees with it. she will call with any problems that may develop before the next visit here. ?Total time spent: 30 mins including face to face time and time spent for planning, charting and co-ordination of care ? ? Suzzette Righter, CMA ?06/19/21 ? ? ? I Gardiner Coins am scribing for Dr. Lindi Adie ? ?***  ?

## 2021-06-23 ENCOUNTER — Other Ambulatory Visit: Payer: Self-pay | Admitting: *Deleted

## 2021-06-23 DIAGNOSIS — R928 Other abnormal and inconclusive findings on diagnostic imaging of breast: Secondary | ICD-10-CM

## 2021-06-24 ENCOUNTER — Other Ambulatory Visit: Payer: Self-pay | Admitting: *Deleted

## 2021-07-02 ENCOUNTER — Ambulatory Visit: Payer: 59 | Admitting: Orthopaedic Surgery

## 2021-07-02 ENCOUNTER — Ambulatory Visit (INDEPENDENT_AMBULATORY_CARE_PROVIDER_SITE_OTHER): Payer: 59

## 2021-07-02 ENCOUNTER — Encounter: Payer: Self-pay | Admitting: Orthopaedic Surgery

## 2021-07-02 VITALS — BP 134/77 | HR 78 | Ht 66.0 in | Wt 128.0 lb

## 2021-07-02 DIAGNOSIS — M1711 Unilateral primary osteoarthritis, right knee: Secondary | ICD-10-CM | POA: Diagnosis not present

## 2021-07-02 DIAGNOSIS — G8929 Other chronic pain: Secondary | ICD-10-CM

## 2021-07-02 DIAGNOSIS — M25561 Pain in right knee: Secondary | ICD-10-CM

## 2021-07-02 MED ORDER — METHYLPREDNISOLONE ACETATE 40 MG/ML IJ SUSP
40.0000 mg | INTRAMUSCULAR | Status: AC | PRN
  Administered 2021-07-02: 40 mg via INTRA_ARTICULAR

## 2021-07-02 MED ORDER — LIDOCAINE HCL 1 % IJ SOLN
0.5000 mL | INTRAMUSCULAR | Status: AC | PRN
  Administered 2021-07-02: .5 mL

## 2021-07-02 MED ORDER — BUPIVACAINE HCL 0.25 % IJ SOLN
4.0000 mL | INTRAMUSCULAR | Status: AC | PRN
  Administered 2021-07-02: 4 mL via INTRA_ARTICULAR

## 2021-07-02 NOTE — Progress Notes (Signed)
? ?Office Visit Note ?  ?Patient: Candice Caldwell           ?Date of Birth: 1955-08-17           ?MRN: [id number] ?Visit Date: 07/02/2021 ?             ?Requested by: Lujean Amel, MD ?[address] ?[address] ?[place],  [place] [address] ?PCP: Lujean Amel, MD ? ? ?Assessment & Plan: ?Visit Diagnoses:  ?1. Chronic pain of right knee   ?2. Unilateral primary osteoarthritis, right knee   ? ? ?Plan: With her persistent knee effusion cortisone injection performed right knee.  We discussed hiking poles appropriate use which should help unload her knees.  She can follow-up if she has ongoing symptoms.  Nothing needed for broken Harrington rods.  She notes some slight curvature in her neck but has no upper extremity radiculopathy symptoms. ? ?Follow-Up Instructions: No follow-ups on file.  ? ?Orders:  ?Orders Placed This Encounter  ?Procedures  ? Large Joint Inj: R knee  ? XR KNEE 3 VIEW RIGHT  ? ?No orders of the defined types were placed in this encounter. ? ? ? ? Procedures: ?Large Joint Inj: R knee on 07/02/2021 9:34 AM ?Indications: pain and joint swelling ?Details: 22 G 1.5 in needle, anterolateral approach ? ?Arthrogram: No ? ?Medications: 40 mg methylPREDNISolone acetate 40 MG/ML; 0.5 mL lidocaine 1 %; 4 mL bupivacaine 0.25 % ?Outcome: tolerated well, no immediate complications ?Procedure, treatment alternatives, risks and benefits explained, specific risks discussed. Consent was given by the patient. Immediately prior to procedure a time out was called to verify the correct patient, procedure, equipment, support staff and site/side marked as required. Patient was prepped and draped in the usual sterile fashion.  ? ? ? ? ?Clinical Data: ?No additional findings. ? ? ?Subjective: ?Chief Complaint  ?Patient presents with  ? Right Knee - Pain  ? ? ?HPI 66 year old female with right knee pain with clicking catching.  She is an avid hiker is mostly the hike this weekend.  She fell couple years ago has had some  increased discomfort in the last 2 months she has had swelling having to use ice Tylenol Aleve after each hiking episode.  She not been through therapy no previous injections.  No history of gout. ? ?Review of Systems past history of breast cancer.  Previous scoliosis fusion in [place] as a teenager with broken Harrington rods.  Positive smoking history. ? ? ?Objective: ?Vital Signs: BP 134/77   Pulse 78   Ht '5\' 6"'$  (1.676 m)   Wt 128 lb (58.1 kg)   BMI 20.66 kg/m?  ? ?Physical Exam ?Constitutional:   ?   Appearance: She is well-developed.  ?HENT:  ?   Head: Normocephalic.  ?   Right Ear: External ear normal.  ?   Left Ear: External ear normal. There is no impacted cerumen.  ?Eyes:  ?   Pupils: Pupils are equal, round, and reactive to light.  ?Neck:  ?   Thyroid: No thyromegaly.  ?   Trachea: No tracheal deviation.  ?Cardiovascular:  ?   Rate and Rhythm: Normal rate.  ?Pulmonary:  ?   Effort: Pulmonary effort is normal.  ?Abdominal:  ?   Palpations: Abdomen is soft.  ?Musculoskeletal:  ?   Cervical back: No rigidity.  ?Skin: ?   General: Skin is warm and dry.  ?Neurological:  ?   Mental Status: She is alert and oriented to person, place, and time.  ?  Psychiatric:     ?   Behavior: Behavior normal.  ? ? ?Ortho Exam well-healed lumbar incision top of the Harrington rods prominent.  No tenderness.  Negative logroll the hips.  2+ to 3+ knee effusion on the right negative on the left collateral ligaments are stable more crepitus with knee range of motion right than the left some discomfort with patellar loading collateral ligaments are stable. ? ?Specialty Comments:  ?No specialty comments available. ? ?Imaging: ?XR KNEE 3 VIEW RIGHT ? ?Result Date: 07/02/2021 ?Standing AP both knees lateral right knee sunrise patella x-ray demonstrates tricompartmental degenerative changes worse in the right knee and left knee mild right knee valgus with marginal osteophytes laterally.  Negative for acute fracture.  No lytic or  sclerotic lesions. Impression: Moderate right knee osteoarthritis mild left knee.  ? ? ?PMFS History: ?Patient Active Problem List  ? Diagnosis Date Noted  ? Malignant neoplasm of upper-inner quadrant of left breast in female, estrogen receptor positive ([employer]) 05/05/2016  ? ?Past Medical History:  ?Diagnosis Date  ? Breast cancer ([facility]) 04/2016  ? Left Breast  ? Cancer ([facility]) 04/2016  ? left breast cancer  ? History of radiation therapy 07/13/16-08/11/16  ? left breast 42.5 Gy in 16 fractions, left breast boost 10 Gy in 5 fractions  ?  ?Family History  ?Problem Relation Age of Onset  ? Heart attack Father   ?  ?Past Surgical History:  ?Procedure Laterality Date  ? BREAST LUMPECTOMY Left 2018  ? BREAST LUMPECTOMY WITH RADIOACTIVE SEED AND SENTINEL LYMPH NODE BIOPSY Left 06/04/2016  ? Procedure: LEFT BREAST LUMPECTOMY WITH RADIOACTIVE SEED AND SENTINEL LYMPH NODE BIOPSY;  Surgeon: Alphonsa Overall, MD;  Location: [facility];  Service: General;  Laterality: Left;  ? EYE SURGERY    ? lasik  ? SPINAL FUSION    ? ?Social History  ? ?Occupational History  ? Occupation: Realtor  ?Tobacco Use  ? Smoking status: Former  ?  Packs/day: 0.50  ?  Years: 30.00  ?  Pack years: 15.00  ?  Types: Cigarettes  ?  Quit date: 04/30/2016  ?  Years since quitting: 5.1  ? Smokeless tobacco: Never  ?Substance and Sexual Activity  ? Alcohol use: Yes  ?  Comment: 6-8 week glasses of wine  ? Drug use: No  ? Sexual activity: Not on file  ? ? ? ? ? ? ?

## 2021-07-03 ENCOUNTER — Ambulatory Visit: Payer: 59 | Admitting: Hematology and Oncology

## 2021-07-07 NOTE — Progress Notes (Signed)
? ?Patient Care Team: ?Lujean Amel, MD as PCP - General (Family Medicine) ?Alphonsa Overall, MD as Consulting Physician (General Surgery) ?Nicholas Lose, MD as Consulting Physician (Hematology and Oncology) ?Eppie Gibson, MD as Attending Physician (Radiation Oncology) ?Gardenia Phlegm, NP as Nurse Practitioner (Hematology and Oncology) ? ?DIAGNOSIS:  ?Encounter Diagnosis  ?Name Primary?  ? Malignant neoplasm of upper-inner quadrant of left breast in female, estrogen receptor positive ([place])   ? ? ?SUMMARY OF ONCOLOGIC HISTORY: ?Oncology History  ?Malignant neoplasm of upper-inner quadrant of left breast in female, estrogen receptor positive ([facility])  ?04/24/2016 Initial Diagnosis  ? Left breast biopsy 12:00: Benign, left breast biopsy 11:00: IDC low-grade, ER 100%, PR 95%, HER-2 negative ratio 1.68, Ki-67 5%; screening detected left breast distortion and calcifications UIQ 1.1 cm at 11:00 position axilla negative, T1 cN0 stage IA clinical stage ? ?  ?06/04/2016 Surgery  ? Left lumpectomy: IDC grade 1, 1.1 cm, margins negative, 0/6 lymph nodes negative, ER 100%, PR 95%, HER-2 negative ratio 1.68, Ki-67 5%, T1 CN 0 stage IA ? ?  ?06/17/2016 Oncotype testing  ? Oncotype DX score 9: Risk of recurrence 7% ? ?  ?07/13/2016 - 08/11/2016 Radiation Therapy  ? Adjuvant radiation therapy ? ?  ?09/03/2016 -  Anti-estrogen oral therapy  ? Anastrozole 1 mg daily ? ?  ? ? ?CHIEF COMPLIANT:  Follow-up of left breast cancer on anastrozole therapy ? ?INTERVAL HISTORY: Candice Caldwell is a 66 y.o. with above-mentioned history of left breast cancer treated with lumpectomy, adjuvant radiation, chemotherapy, and is currently on antiestrogen therapy with anastrozole. She presents to the clinic today for follow-up. She state that everything was going well. She state that she has lost some weight. She state that she tolerated the anastrozole well. Denies pain and discomfort in breast ? ? ?ALLERGIES:  has No Known Allergies. ? ?MEDICATIONS:   ?Current Outpatient Medications  ?Medication Sig Dispense Refill  ? acetaminophen (TYLENOL) 500 MG tablet Take 1 tablet (500 mg total) by mouth in the morning and at bedtime. 30 tablet 0  ? alendronate (FOSAMAX) 70 MG tablet Take 1 tablet (70 mg total) by mouth once a week. Take with a full glass of water on an empty stomach. 12 tablet 4  ? anastrozole (ARIMIDEX) 1 MG tablet Take 1 tablet (1 mg total) by mouth daily. 90 tablet 4  ? ascorbic acid (VITAMIN C) 500 MG tablet Take 500 mg by mouth daily in the afternoon.    ? aspirin 81 MG tablet Take 81 mg by mouth daily. (Patient not taking: Reported on 03/27/2021)    ? Calcium Carbonate-Vitamin D 600-5 MG-MCG TABS Take 1 tablet by mouth daily.    ? Magnesium 250 MG TABS Take 250 mg by mouth daily in the afternoon.    ? MULTIPLE VITAMIN PO Take by mouth.    ? Multiple Vitamins-Minerals (OCUVITE EXTRA) TABS Take 1 tablet by mouth daily.    ? Omega-3 Fatty Acids (FISH OIL CONCENTRATE PO) Take 1 tablet by mouth daily.    ? traZODone (DESYREL) 100 MG tablet Take 100 mg by mouth at bedtime.    ? ?No current facility-administered medications for this visit.  ? ? ?PHYSICAL EXAMINATION: ?ECOG PERFORMANCE STATUS: 1 - Symptomatic but completely ambulatory ? ?Vitals:  ? 07/21/21 1444  ?BP: (!) 144/64  ?Pulse: (!) 59  ?Resp: 17  ?Temp: 97.7 ?F (36.5 ?C)  ?SpO2: 100%  ? ?Filed Weights  ? 07/21/21 1444  ?Weight: 121 lb 11.2 oz (55.2 kg)  ? ?  ? ?  LABORATORY DATA:  ?I have reviewed the data as listed ? ?  Latest Ref Rng & Units 02/21/2021  ?  5:24 PM 05/06/2016  ?  8:22 AM  ?CMP  ?Glucose 70 - 99 mg/dL 143   114    ?BUN 8 - 23 mg/dL 14   14.5    ?Creatinine 0.44 - 1.00 mg/dL 0.51   0.8    ?Sodium 135 - 145 mmol/L 138   138    ?Potassium 3.5 - 5.1 mmol/L 3.7   3.9    ?Chloride 98 - 111 mmol/L 104     ?CO2 22 - 32 mmol/L 27   24    ?Calcium 8.9 - 10.3 mg/dL 9.5   9.8    ?Total Protein 6.4 - 8.3 g/dL  7.6    ?Total Bilirubin 0.20 - 1.20 mg/dL  0.47    ?Alkaline Phos 40 - 150 U/L  79     ?AST 5 - 34 U/L  24    ?ALT 0 - 55 U/L  26    ? ? ?Lab Results  ?Component Value Date  ? WBC 7.3 02/21/2021  ? HGB 12.0 02/21/2021  ? HCT 36.2 02/21/2021  ? MCV 96.3 02/21/2021  ? PLT 202 02/21/2021  ? NEUTROABS 4.6 05/06/2016  ? ? ?ASSESSMENT & PLAN:  ?Malignant neoplasm of upper-inner quadrant of left breast in female, estrogen receptor positive ([facility]) ?Left lumpectomy 06/04/2016: IDC grade 1, 1.1 cm, margins negative, 0/6 lymph nodes negative, ER 100%, PR 95%, HER-2 negative ratio 1.68, Ki-67 5%, T1 CN 0 stage IA ?Oncotype DX score 9: Risk of recurrence 7% ?Adjuvant radiation therapy 07/13/2016 to 08/11/2016 ?  ?Treatment plan: Adjuvant antiestrogen therapy with anastrozole 1 mg by mouth daily Started 08/2016 ?We discussed the duration of antiestrogen therapy and recommended 7 years of treatment. ? ?Anastrozole toxicities: Denies any hot flashes or myalgias ?  ?Breast Cancer Surveillance: ?1. Breast exam 07/21/2021: Normal ?2. Mammogram 06/20/2021: no abnormalities. Postsurgical changes. Breast Density Category C.  ?  ?Broken ribs: Still hurting ?Osteopenia: Currently on Fosamax and calcium and vitamin D ?Return to clinic in 1 year for follow-up ? ? ? ?No orders of the defined types were placed in this encounter. ? ?The patient has a good understanding of the overall plan. she agrees with it. she will call with any problems that may develop before the next visit here. ?Total time spent: 30 mins including face to face time and time spent for planning, charting and co-ordination of care ? ? Harriette Ohara, MD ?07/21/21 ? ? ? I Gardiner Coins am scribing for Dr. Lindi Adie ? ?I have reviewed the above documentation for accuracy and completeness, and I agree with the above. ?  ?

## 2021-07-20 ENCOUNTER — Other Ambulatory Visit: Payer: Self-pay | Admitting: Hematology and Oncology

## 2021-07-21 ENCOUNTER — Inpatient Hospital Stay: Payer: 59 | Attending: Hematology and Oncology | Admitting: Hematology and Oncology

## 2021-07-21 ENCOUNTER — Other Ambulatory Visit: Payer: Self-pay

## 2021-07-21 DIAGNOSIS — M8580 Other specified disorders of bone density and structure, unspecified site: Secondary | ICD-10-CM | POA: Diagnosis not present

## 2021-07-21 DIAGNOSIS — Z79899 Other long term (current) drug therapy: Secondary | ICD-10-CM | POA: Diagnosis not present

## 2021-07-21 DIAGNOSIS — Z170 Estrogen receptor positive status [ER+]: Secondary | ICD-10-CM | POA: Insufficient documentation

## 2021-07-21 DIAGNOSIS — C50212 Malignant neoplasm of upper-inner quadrant of left female breast: Secondary | ICD-10-CM | POA: Diagnosis present

## 2021-07-21 DIAGNOSIS — Z79811 Long term (current) use of aromatase inhibitors: Secondary | ICD-10-CM | POA: Diagnosis not present

## 2021-07-21 DIAGNOSIS — R634 Abnormal weight loss: Secondary | ICD-10-CM | POA: Diagnosis not present

## 2021-07-21 MED ORDER — ANASTROZOLE 1 MG PO TABS: 1.0000 mg | ORAL_TABLET | Freq: Every day | ORAL | 4 refills | Status: DC

## 2021-07-21 MED ORDER — ACETAMINOPHEN 500 MG PO TABS: 500.0000 mg | ORAL_TABLET | Freq: Two times a day (BID) | ORAL | 0 refills | Status: AC

## 2021-07-21 NOTE — Assessment & Plan Note (Signed)
Left lumpectomy 06/04/2016: IDC grade 1, 1.1 cm, margins negative, 0/6 lymph nodes negative, ER 100%, PR 95%, HER-2 negative ratio 1.68, Ki-67 5%, T1 CN 0 stage IA ?Oncotype DX score 9: Risk of recurrence 7% ?Adjuvant radiation therapy 07/13/2016 to 08/11/2016 ?? ?Treatment plan: Adjuvant antiestrogen therapy with anastrozole 1 mg by mouth daily?Started 08/2016 ?? ?Anastrozole?toxicities: Denies any hot flashes or myalgias ?? ?Breast Cancer Surveillance: ?1. Breast exam?07/21/2021: Normal ?2. Mammogram?06/20/2021: no abnormalities. Postsurgical changes. Breast Density Category?C.? ?? ?Broken ribs: Still hurting ?? ?Return to clinic in 1 year for follow-up ?

## 2021-08-29 ENCOUNTER — Other Ambulatory Visit: Payer: Self-pay | Admitting: Hematology and Oncology

## 2021-08-29 DIAGNOSIS — M8580 Other specified disorders of bone density and structure, unspecified site: Secondary | ICD-10-CM

## 2021-12-31 ENCOUNTER — Other Ambulatory Visit: Payer: Self-pay | Admitting: Family Medicine

## 2021-12-31 DIAGNOSIS — N951 Menopausal and female climacteric states: Secondary | ICD-10-CM

## 2021-12-31 DIAGNOSIS — Z780 Asymptomatic menopausal state: Secondary | ICD-10-CM

## 2022-02-04 ENCOUNTER — Other Ambulatory Visit: Payer: Commercial Managed Care - HMO

## 2022-02-11 ENCOUNTER — Ambulatory Visit (INDEPENDENT_AMBULATORY_CARE_PROVIDER_SITE_OTHER): Payer: Commercial Managed Care - HMO

## 2022-02-11 DIAGNOSIS — N951 Menopausal and female climacteric states: Secondary | ICD-10-CM | POA: Diagnosis not present

## 2022-02-11 DIAGNOSIS — Z780 Asymptomatic menopausal state: Secondary | ICD-10-CM

## 2022-04-09 ENCOUNTER — Other Ambulatory Visit: Payer: Self-pay

## 2022-04-09 DIAGNOSIS — Z87891 Personal history of nicotine dependence: Secondary | ICD-10-CM

## 2022-04-20 ENCOUNTER — Ambulatory Visit: Payer: Commercial Managed Care - HMO

## 2022-04-30 ENCOUNTER — Ambulatory Visit: Payer: Medicare Other

## 2022-04-30 DIAGNOSIS — Z87891 Personal history of nicotine dependence: Secondary | ICD-10-CM

## 2022-05-04 ENCOUNTER — Other Ambulatory Visit: Payer: Self-pay

## 2022-05-04 DIAGNOSIS — Z87891 Personal history of nicotine dependence: Secondary | ICD-10-CM

## 2022-07-21 NOTE — Assessment & Plan Note (Signed)
Left lumpectomy 06/04/2016: IDC grade 1, 1.1 cm, margins negative, 0/6 lymph nodes negative, ER 100%, PR 95%, HER-2 negative ratio 1.68, Ki-67 5%, T1 CN 0 stage IA Oncotype DX score 9: Risk of recurrence 7% Adjuvant radiation therapy 07/13/2016 to 08/11/2016   Treatment plan: Adjuvant antiestrogen therapy with anastrozole 1 mg by mouth daily Started 08/2016 We discussed the duration of antiestrogen therapy and recommended 7 years of treatment.   Anastrozole toxicities: Denies any hot flashes or myalgias   Breast Cancer Surveillance: 1. Breast exam 07/22/2022: Normal 2. Mammogram 07/17/2021: no abnormalities. Postsurgical changes. Breast Density Category C.    Broken ribs: Still hurting Osteopenia: Currently on Fosamax and calcium and vitamin D Return to clinic in 1 year for follow-up

## 2022-07-21 NOTE — Progress Notes (Signed)
Patient Care Team: Darrow Bussing, MD as PCP - General (Family Medicine) Ovidio Kin, MD as Consulting Physician (General Surgery) Serena Croissant, MD as Consulting Physician (Hematology and Oncology) Lonie Peak, MD as Attending Physician (Radiation Oncology) Loa Socks, NP as Nurse Practitioner (Hematology and Oncology)  DIAGNOSIS: No diagnosis found.  SUMMARY OF ONCOLOGIC HISTORY: Oncology History  Malignant neoplasm of upper-inner quadrant of left breast in female, estrogen receptor positive ([facility])  04/24/2016 Initial Diagnosis   Left breast biopsy 12:00: Benign, left breast biopsy 11:00: IDC low-grade, ER 100%, PR 95%, HER-2 negative ratio 1.68, Ki-67 5%; screening detected left breast distortion and calcifications UIQ 1.1 cm at 11:00 position axilla negative, T1 cN0 stage IA clinical stage   06/04/2016 Surgery   Left lumpectomy: IDC grade 1, 1.1 cm, margins negative, 0/6 lymph nodes negative, ER 100%, PR 95%, HER-2 negative ratio 1.68, Ki-67 5%, T1 CN 0 stage IA   06/17/2016 Oncotype testing   Oncotype DX score 9: Risk of recurrence 7%   07/13/2016 - 08/11/2016 Radiation Therapy   Adjuvant radiation therapy   09/03/2016 -  Anti-estrogen oral therapy   Anastrozole 1 mg daily     CHIEF COMPLIANT:   INTERVAL HISTORY: Candice Caldwell is a   ALLERGIES:  has No Known Allergies.  MEDICATIONS:  Current Outpatient Medications  Medication Sig Dispense Refill   acetaminophen (TYLENOL) 500 MG tablet Take 1 tablet (500 mg total) by mouth in the morning and at bedtime. 30 tablet 0   alendronate (FOSAMAX) 70 MG tablet TAKE 1 TABLET BY MOUTH ONCE WEEKLY ON AN EMPTY STOMACH BEFORE BREAKFAST. REMAIN UPRIGHT FOR 30 MINUTES AND TAKE WITH 8 OUNCES OF WATER 12 tablet 4   anastrozole (ARIMIDEX) 1 MG tablet Take 1 tablet (1 mg total) by mouth daily. 90 tablet 4   ascorbic acid (VITAMIN C) 500 MG tablet Take 500 mg by mouth daily in the afternoon.     aspirin 81 MG tablet Take 81 mg  by mouth daily. (Patient not taking: Reported on 03/27/2021)     Calcium Carbonate-Vitamin D 600-5 MG-MCG TABS Take 1 tablet by mouth daily.     Magnesium 250 MG TABS Take 250 mg by mouth daily in the afternoon.     MULTIPLE VITAMIN PO Take by mouth.     Multiple Vitamins-Minerals (OCUVITE EXTRA) TABS Take 1 tablet by mouth daily.     Omega-3 Fatty Acids (FISH OIL CONCENTRATE PO) Take 1 tablet by mouth daily.     traZODone (DESYREL) 100 MG tablet Take 100 mg by mouth at bedtime.     No current facility-administered medications for this visit.    PHYSICAL EXAMINATION: ECOG PERFORMANCE STATUS: {CHL ONC ECOG PS:[id number]}  There were no vitals filed for this visit. There were no vitals filed for this visit.  BREAST:*** No palpable masses or nodules in either right or left breasts. No palpable axillary supraclavicular or infraclavicular adenopathy no breast tenderness or nipple discharge. (exam performed in the presence of a chaperone)  LABORATORY DATA:  I have reviewed the data as listed    Latest Ref Rng & Units 02/21/2021    5:24 PM 05/06/2016    8:22 AM  CMP  Glucose 70 - 99 mg/dL 161  096   BUN 8 - 23 mg/dL 14  04.5   Creatinine 4.09 - 1.00 mg/dL 8.11  0.8   Sodium 914 - 145 mmol/L 138  138   Potassium 3.5 - 5.1 mmol/L 3.7  3.9   Chloride 98 -  111 mmol/L 104    CO2 22 - 32 mmol/L 27  24   Calcium 8.9 - 10.3 mg/dL 9.5  9.8   Total Protein 6.4 - 8.3 g/dL  7.6   Total Bilirubin 0.20 - 1.20 mg/dL  1.61   Alkaline Phos 40 - 150 U/L  79   AST 5 - 34 U/L  24   ALT 0 - 55 U/L  26     Lab Results  Component Value Date   WBC 7.3 02/21/2021   HGB 12.0 02/21/2021   HCT 36.2 02/21/2021   MCV 96.3 02/21/2021   PLT 202 02/21/2021   NEUTROABS 4.6 05/06/2016    ASSESSMENT & PLAN:  No problem-specific Assessment & Plan notes found for this encounter.    No orders of the defined types were placed in this encounter.  The patient has a good understanding of the overall plan. she  agrees with it. she will call with any problems that may develop before the next visit here. Total time spent: 30 mins including face to face time and time spent for planning, charting and co-ordination of care   Sherlyn Lick, CMA 07/21/22    I Janan Ridge am acting as a Neurosurgeon for [employer]  ***

## 2022-07-22 ENCOUNTER — Telehealth: Payer: Self-pay | Admitting: Hematology and Oncology

## 2022-07-22 ENCOUNTER — Inpatient Hospital Stay: Payer: Medicare Other | Attending: Hematology and Oncology | Admitting: Hematology and Oncology

## 2022-07-22 ENCOUNTER — Other Ambulatory Visit: Payer: Self-pay

## 2022-07-22 VITALS — BP 137/54 | HR 66 | Temp 97.3°F | Resp 18 | Ht 66.0 in | Wt 130.7 lb

## 2022-07-22 DIAGNOSIS — Z79899 Other long term (current) drug therapy: Secondary | ICD-10-CM | POA: Insufficient documentation

## 2022-07-22 DIAGNOSIS — Z79811 Long term (current) use of aromatase inhibitors: Secondary | ICD-10-CM | POA: Insufficient documentation

## 2022-07-22 DIAGNOSIS — M8580 Other specified disorders of bone density and structure, unspecified site: Secondary | ICD-10-CM | POA: Diagnosis not present

## 2022-07-22 DIAGNOSIS — Z170 Estrogen receptor positive status [ER+]: Secondary | ICD-10-CM

## 2022-07-22 DIAGNOSIS — C50212 Malignant neoplasm of upper-inner quadrant of left female breast: Secondary | ICD-10-CM

## 2022-07-22 MED ORDER — ALENDRONATE SODIUM 70 MG PO TABS: 70.0000 mg | ORAL_TABLET | ORAL | 4 refills | Status: DC

## 2022-07-22 MED ORDER — ANASTROZOLE 1 MG PO TABS: 1.0000 mg | ORAL_TABLET | Freq: Every day | ORAL | 4 refills | Status: DC

## 2022-07-22 NOTE — Telephone Encounter (Signed)
Scheduled appointment per 5/15 los. Left voicemail.

## 2022-08-16 IMAGING — MG MM DIGITAL SCREENING BILAT W/ TOMO AND CAD
8 series · 9 of 24 positions shown · non-contrast
Comparison: Previous exam(s).

CLINICAL DATA: Screening.

EXAM:
DIGITAL SCREENING BILATERAL MAMMOGRAM WITH TOMOSYNTHESIS AND CAD
TECHNIQUE: Bilateral screening digital craniocaudal and mediolateral oblique
mammograms were obtained. Bilateral screening digital breast
tomosynthesis was performed. The images were evaluated with
computer-aided detection.

[R CC synth-2D]
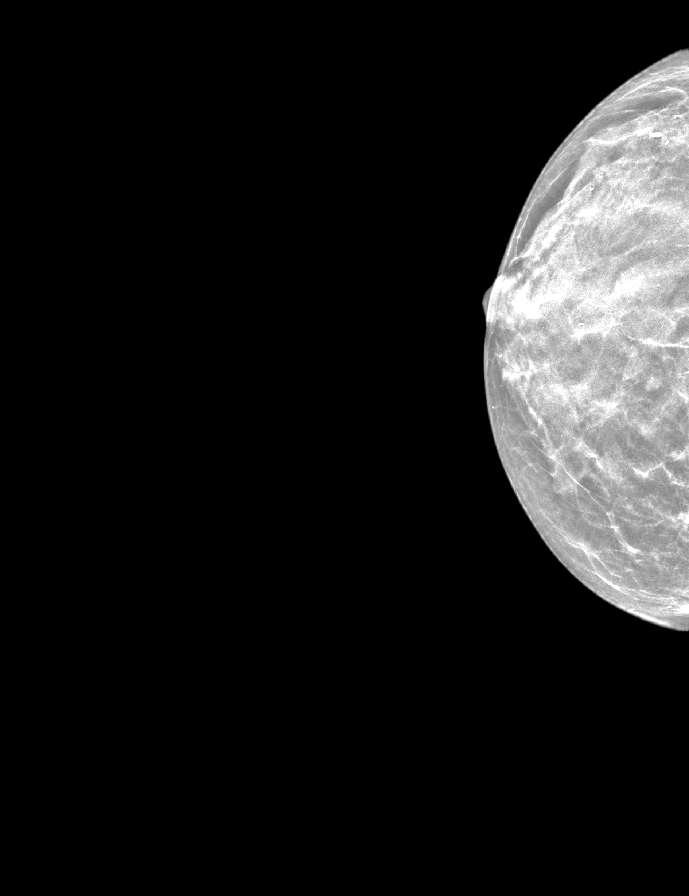

[L MLO synth-2D]
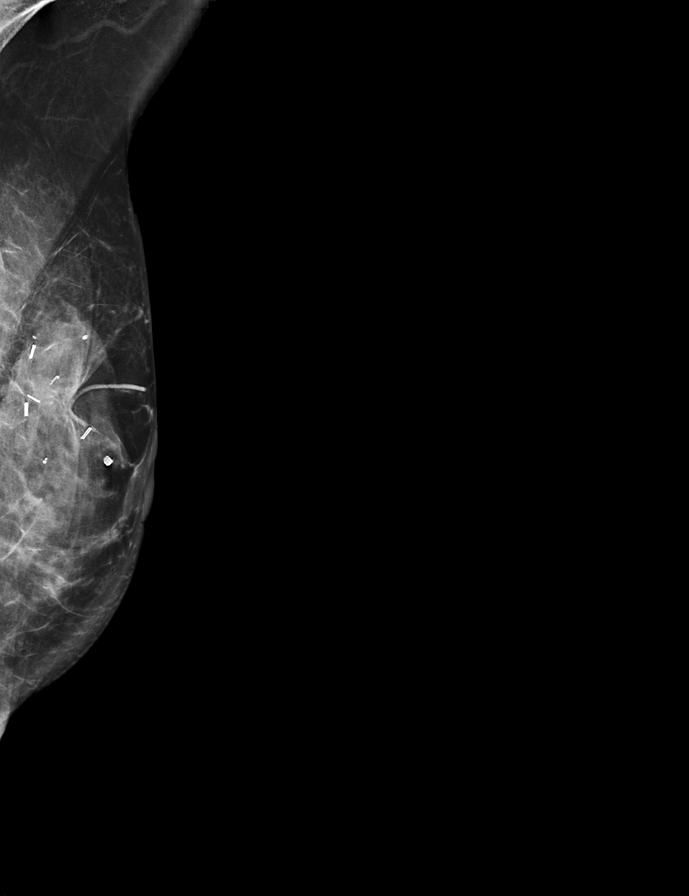

[L CC synth-2D]
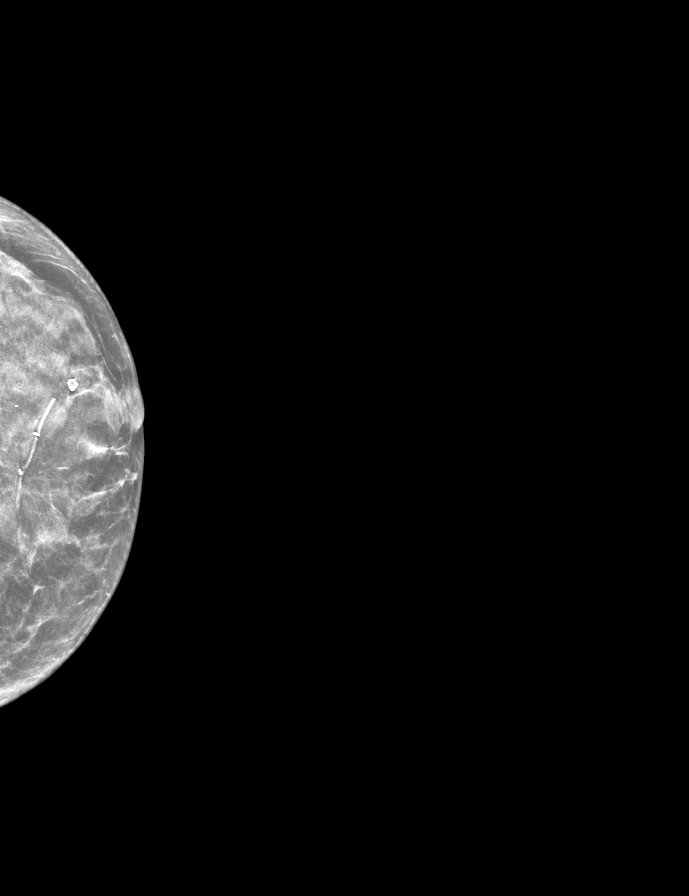

[R MLO synth-2D]
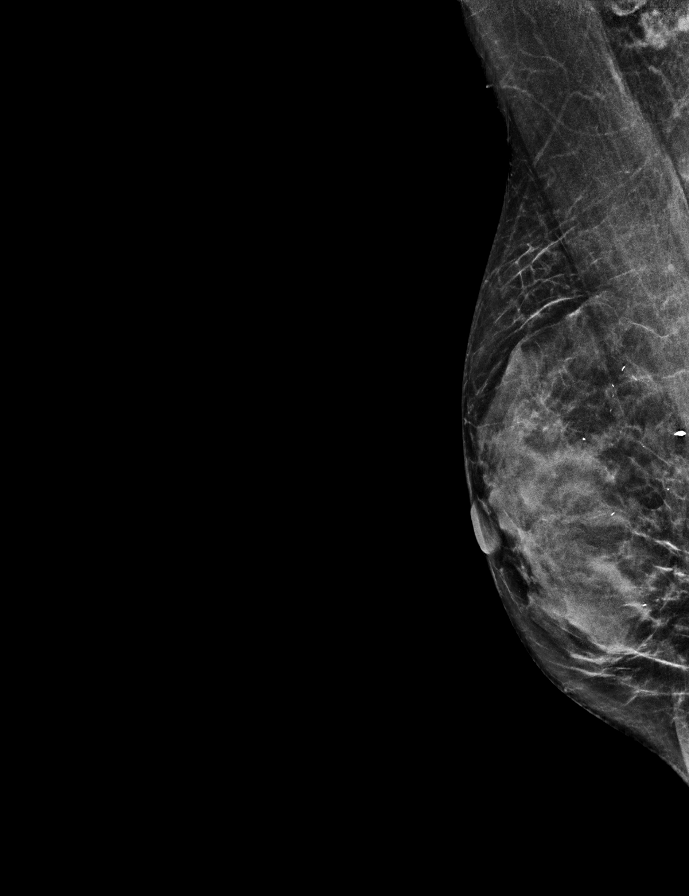

[R MLO tomo · 2 of 47 frames shown]
[frame 16/47]
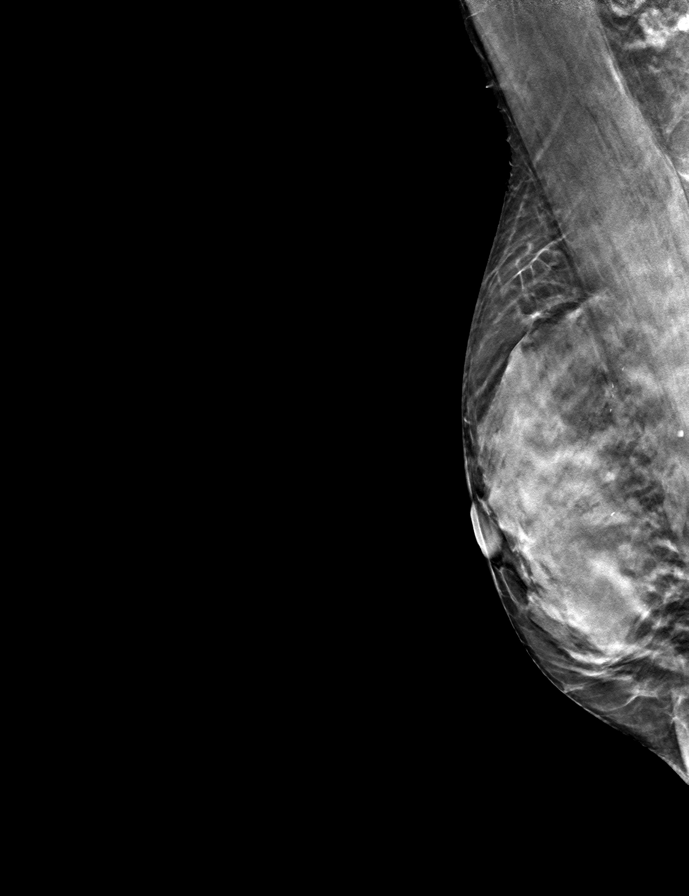
[frame 24/47]
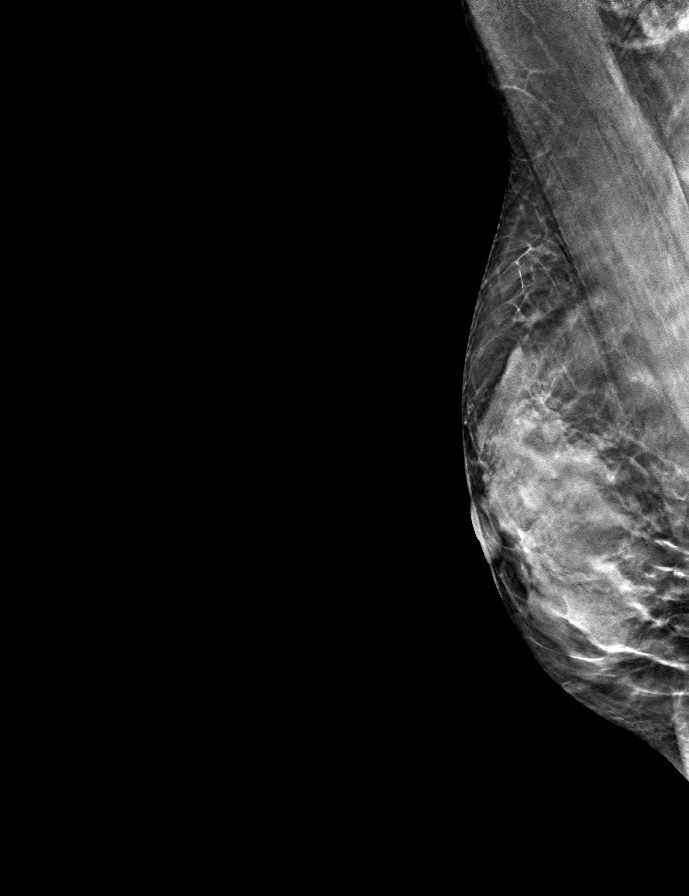

[R CC tomo · tomo slice 20/39.0]
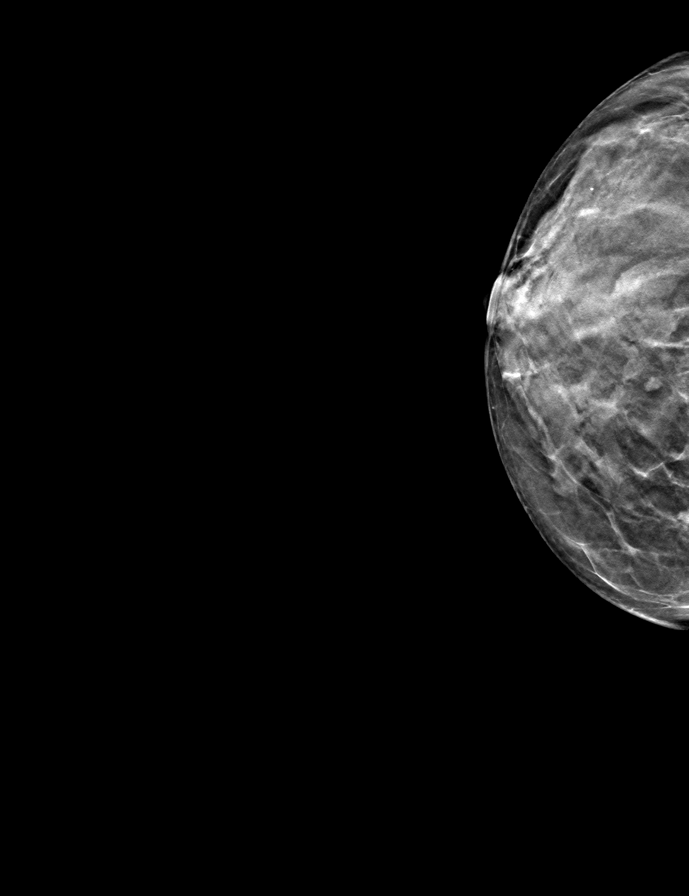

[L MLO tomo · tomo slice 31/60.0]
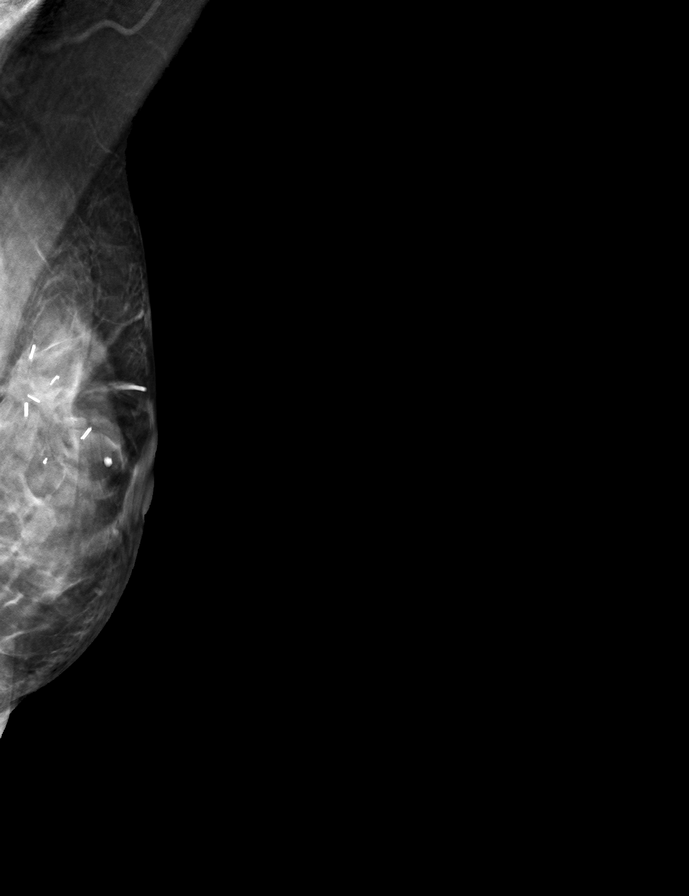

[L CC tomo · tomo slice 22/43.0]
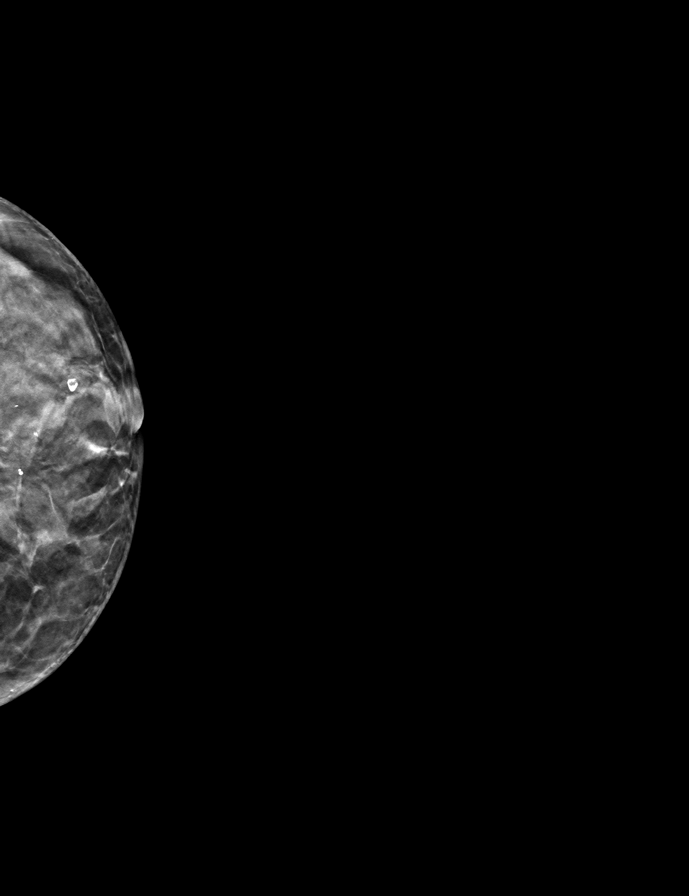

[9 of 24 positions shown; findings below may reference images not displayed]

ACR Breast Density Category d: The breast tissue is extremely dense,
which lowers the sensitivity of mammography.
FINDINGS: In the right breast, possible asymmetries warrant further
evaluation. In the left breast, no findings suspicious for
malignancy. Stable post lumpectomy changes.
IMPRESSION: Further evaluation is suggested for possible asymmetries in the
right breast.

RECOMMENDATION:
Diagnostic mammogram and possibly ultrasound of the right breast.
(Code:[id number])

The patient will be contacted regarding the findings, and additional
imaging will be scheduled.

BI-RADS CATEGORY  0: Incomplete. Need additional imaging evaluation
and/or prior mammograms for comparison.

## 2023-05-03 ENCOUNTER — Ambulatory Visit: Payer: Medicare Other

## 2023-05-03 DIAGNOSIS — Z122 Encounter for screening for malignant neoplasm of respiratory organs: Secondary | ICD-10-CM

## 2023-05-03 DIAGNOSIS — Z87891 Personal history of nicotine dependence: Secondary | ICD-10-CM | POA: Diagnosis not present

## 2023-05-03 DIAGNOSIS — J439 Emphysema, unspecified: Secondary | ICD-10-CM | POA: Diagnosis not present

## 2023-05-03 DIAGNOSIS — R918 Other nonspecific abnormal finding of lung field: Secondary | ICD-10-CM | POA: Diagnosis not present

## 2023-05-24 ENCOUNTER — Other Ambulatory Visit: Payer: Self-pay | Admitting: Acute Care

## 2023-05-24 DIAGNOSIS — Z87891 Personal history of nicotine dependence: Secondary | ICD-10-CM

## 2023-05-24 DIAGNOSIS — Z122 Encounter for screening for malignant neoplasm of respiratory organs: Secondary | ICD-10-CM

## 2023-06-01 ENCOUNTER — Ambulatory Visit

## 2023-07-22 ENCOUNTER — Inpatient Hospital Stay: Payer: Medicare Other | Attending: Hematology and Oncology | Admitting: Hematology and Oncology

## 2023-07-22 VITALS — BP 131/71 | HR 63 | Temp 98.2°F | Resp 18 | Ht 66.0 in | Wt 138.3 lb

## 2023-07-22 DIAGNOSIS — Z7982 Long term (current) use of aspirin: Secondary | ICD-10-CM | POA: Diagnosis not present

## 2023-07-22 DIAGNOSIS — Z1721 Progesterone receptor positive status: Secondary | ICD-10-CM | POA: Diagnosis not present

## 2023-07-22 DIAGNOSIS — Z79811 Long term (current) use of aromatase inhibitors: Secondary | ICD-10-CM | POA: Diagnosis not present

## 2023-07-22 DIAGNOSIS — Z79899 Other long term (current) drug therapy: Secondary | ICD-10-CM | POA: Insufficient documentation

## 2023-07-22 DIAGNOSIS — Z170 Estrogen receptor positive status [ER+]: Secondary | ICD-10-CM | POA: Insufficient documentation

## 2023-07-22 DIAGNOSIS — C50212 Malignant neoplasm of upper-inner quadrant of left female breast: Secondary | ICD-10-CM | POA: Diagnosis present

## 2023-07-22 MED ORDER — ANASTROZOLE 1 MG PO TABS: 1.0000 mg | ORAL_TABLET | Freq: Every day | ORAL | 4 refills | Status: DC

## 2023-07-22 NOTE — Progress Notes (Unsigned)
 Patient Care Team: Lanae Pinal, MD as PCP - General (Family Medicine) Juanita Norlander, MD as Consulting Physician (General Surgery) Cameron Cea, MD as Consulting Physician (Hematology and Oncology) Colie Dawes, MD as Attending Physician (Radiation Oncology) Percival Brace, NP as Nurse Practitioner (Hematology and Oncology)  DIAGNOSIS:  Encounter Diagnosis  Name Primary?   Malignant neoplasm of upper-inner quadrant of left breast in female, estrogen receptor positive (HCC) Yes    SUMMARY OF ONCOLOGIC HISTORY: Oncology History  Malignant neoplasm of upper-inner quadrant of left breast in female, estrogen receptor positive (HCC)  04/24/2016 Initial Diagnosis   Left breast biopsy 12:00: Benign, left breast biopsy 11:00: IDC low-grade, ER 100%, PR 95%, HER-2 negative ratio 1.68, Ki-67 5%; screening detected left breast distortion and calcifications UIQ 1.1 cm at 11:00 position axilla negative, T1 cN0 stage IA clinical stage   06/04/2016 Surgery   Left lumpectomy: IDC grade 1, 1.1 cm, margins negative, 0/6 lymph nodes negative, ER 100%, PR 95%, HER-2 negative ratio 1.68, Ki-67 5%, T1 CN 0 stage IA   06/17/2016 Oncotype testing   Oncotype DX score 9: Risk of recurrence 7%   07/13/2016 - 08/11/2016 Radiation Therapy   Adjuvant radiation therapy   09/03/2016 -  Anti-estrogen oral therapy   Anastrozole  1 mg daily     CHIEF COMPLIANT: F/U on anastrozole   HISTORY OF PRESENT ILLNESS:  History of Present Illness Candice Caldwell is a 68 year old female who presents for a follow-up regarding her medication use.  She has been taking anastrozole  for seven years for estrogen receptor-positive breast cancer without significant side effects such as stiffness, achiness, or hot flashes. She is considering extending the medication use to ten years.  A recent CT scan of her lung for lung cancer screening showed normal results.     ALLERGIES:  has no known allergies.  MEDICATIONS:   Current Outpatient Medications  Medication Sig Dispense Refill   acetaminophen  (TYLENOL ) 500 MG tablet Take 1 tablet (500 mg total) by mouth in the morning and at bedtime. 30 tablet 0   alendronate  (FOSAMAX ) 70 MG tablet Take 1 tablet (70 mg total) by mouth once a week. Take with a full glass of water on an empty stomach. 12 tablet 4   anastrozole  (ARIMIDEX ) 1 MG tablet Take 1 tablet (1 mg total) by mouth daily. 90 tablet 4   ascorbic acid (VITAMIN C) 500 MG tablet Take 500 mg by mouth daily in the afternoon.     aspirin 81 MG tablet Take 81 mg by mouth daily.     Calcium Carbonate-Vitamin D 600-5 MG-MCG TABS Take 1 tablet by mouth daily.     Cholecalciferol (VITAMIN D3) 10 MCG (400 UNIT) tablet Take by mouth.     Magnesium 250 MG TABS Take 250 mg by mouth daily in the afternoon.     MULTIPLE VITAMIN PO Take by mouth.     Multiple Vitamins-Minerals (OCUVITE EXTRA) TABS Take 1 tablet by mouth daily.     Omega-3 Fatty Acids (FISH OIL CONCENTRATE PO) Take 1 tablet by mouth daily.     traZODone (DESYREL) 100 MG tablet Take 100 mg by mouth at bedtime.     No current facility-administered medications for this visit.    PHYSICAL EXAMINATION: ECOG PERFORMANCE STATUS: 1 - Symptomatic but completely ambulatory  Vitals:   07/22/23 1508  BP: 131/71  Pulse: 63  Resp: 18  Temp: 98.2 F (36.8 C)  SpO2: 96%   Filed Weights   07/22/23 1508  Weight: 138 lb 4.8 oz (62.7 kg)    Physical Exam No palpable lumps or nodules  (exam performed in the presence of a chaperone)  LABORATORY DATA:  I have reviewed the data as listed    Latest Ref Rng & Units 02/21/2021    5:24 PM 05/06/2016    8:22 AM  CMP  Glucose 70 - 99 mg/dL 829  562   BUN 8 - 23 mg/dL 14  13.0   Creatinine 8.65 - 1.00 mg/dL 7.84  0.8   Sodium 696 - 145 mmol/L 138  138   Potassium 3.5 - 5.1 mmol/L 3.7  3.9   Chloride 98 - 111 mmol/L 104    CO2 22 - 32 mmol/L 27  24   Calcium 8.9 - 10.3 mg/dL 9.5  9.8   Total Protein 6.4 -  8.3 g/dL  7.6   Total Bilirubin 0.20 - 1.20 mg/dL  2.95   Alkaline Phos 40 - 150 U/L  79   AST 5 - 34 U/L  24   ALT 0 - 55 U/L  26     Lab Results  Component Value Date   WBC 7.3 02/21/2021   HGB 12.0 02/21/2021   HCT 36.2 02/21/2021   MCV 96.3 02/21/2021   PLT 202 02/21/2021   NEUTROABS 4.6 05/06/2016    ASSESSMENT & PLAN:  Malignant neoplasm of upper-inner quadrant of left breast in female, estrogen receptor positive (HCC) Left lumpectomy 06/04/2016: IDC grade 1, 1.1 cm, margins negative, 0/6 lymph nodes negative, ER 100%, PR 95%, HER-2 negative ratio 1.68, Ki-67 5%, T1 CN 0 stage IA Oncotype DX score 9: Risk of recurrence 7% Adjuvant radiation therapy 07/13/2016 to 08/11/2016   Treatment plan: Adjuvant antiestrogen therapy with anastrozole  1 mg by mouth daily Started 08/2016 she wishes to continue for 10 years   Anastrozole  toxicities: Denies any hot flashes or myalgias   Breast Cancer Surveillance: 1. Breast exam 07/22/2023: Normal 2. Mammogram 06/25/2023: no abnormalities. Postsurgical changes. Breast Density Category C.    Osteopenia: Currently on Fosamax  and calcium and vitamin D Bone density 02/11/2022: T-score -1.9    She stays extremely active by hiking on trails with her dog. Return to clinic in 1 year for follow-up  No orders of the defined types were placed in this encounter.  The patient has a good understanding of the overall plan. she agrees with it. she will call with any problems that may develop before the next visit here. Total time spent: 30 mins including face to face time and time spent for planning, charting and co-ordination of care   Viinay K Taylan Mayhan, MD 07/22/23

## 2023-07-22 NOTE — Assessment & Plan Note (Signed)
 Left lumpectomy 06/04/2016: IDC grade 1, 1.1 cm, margins negative, 0/6 lymph nodes negative, ER 100%, PR 95%, HER-2 negative ratio 1.68, Ki-67 5%, T1 CN 0 stage IA Oncotype DX score 9: Risk of recurrence 7% Adjuvant radiation therapy 07/13/2016 to 08/11/2016   Treatment plan: Adjuvant antiestrogen therapy with anastrozole  1 mg by mouth daily Started 08/2016 We discussed the duration of antiestrogen therapy and recommended 7 years of treatment.  However if she wishes to continue beyond 7 years we could make the decision next year.   Anastrozole  toxicities: Denies any hot flashes or myalgias   Breast Cancer Surveillance: 1. Breast exam 07/22/2023: Normal 2. Mammogram 06/25/2023: no abnormalities. Postsurgical changes. Breast Density Category C.    Osteopenia: Currently on Fosamax  and calcium and vitamin D Bone density 02/11/2022: T-score -1.9 I renewed the prescription for Fosamax  as well as anastrozole  for another year.  She will make a decision next year whether she wants to come off the medication or continue beyond 7 years.   She stays extremely active by hiking on trails with her dog. Return to clinic in 1 year for follow-up

## 2023-07-30 ENCOUNTER — Other Ambulatory Visit: Payer: Self-pay | Admitting: Hematology and Oncology

## 2023-07-30 DIAGNOSIS — M8580 Other specified disorders of bone density and structure, unspecified site: Secondary | ICD-10-CM

## 2023-10-06 ENCOUNTER — Other Ambulatory Visit: Payer: Self-pay | Admitting: Hematology and Oncology

## 2024-01-31 ENCOUNTER — Other Ambulatory Visit (HOSPITAL_BASED_OUTPATIENT_CLINIC_OR_DEPARTMENT_OTHER): Payer: Self-pay | Admitting: Family Medicine

## 2024-01-31 DIAGNOSIS — E2839 Other primary ovarian failure: Secondary | ICD-10-CM

## 2024-05-03 ENCOUNTER — Ambulatory Visit

## 2024-07-20 ENCOUNTER — Inpatient Hospital Stay: Admitting: Hematology and Oncology
